# Patient Record
Sex: Male | Born: 1955 | Race: White | Hispanic: No | Marital: Married | Smoking: Former smoker
Health system: Southern US, Community
[De-identification: ages and names within clinical notes are randomized; demographics above are authoritative.]

## PROBLEM LIST (undated history)

## (undated) DIAGNOSIS — T4145XA Adverse effect of unspecified anesthetic, initial encounter: Secondary | ICD-10-CM

## (undated) DIAGNOSIS — C801 Malignant (primary) neoplasm, unspecified: Secondary | ICD-10-CM

## (undated) DIAGNOSIS — T8859XA Other complications of anesthesia, initial encounter: Secondary | ICD-10-CM

## (undated) DIAGNOSIS — M1990 Unspecified osteoarthritis, unspecified site: Secondary | ICD-10-CM

## (undated) DIAGNOSIS — I10 Essential (primary) hypertension: Secondary | ICD-10-CM

## (undated) DIAGNOSIS — E119 Type 2 diabetes mellitus without complications: Secondary | ICD-10-CM

## (undated) HISTORY — PX: TONSILLECTOMY: SUR1361

## (undated) HISTORY — DX: Type 2 diabetes mellitus without complications: E11.9

## (undated) HISTORY — PX: COLONOSCOPY: SHX174

## (undated) HISTORY — PX: SHOULDER ARTHROSCOPY WITH ROTATOR CUFF REPAIR: SHX5685

## (undated) HISTORY — PX: KNEE ARTHROSCOPY: SUR90

## (undated) HISTORY — PX: KNEE ARTHROSCOPY W/ DEBRIDEMENT: SHX1867

---

## 1998-08-22 ENCOUNTER — Emergency Department (HOSPITAL_COMMUNITY): Admission: EM | Admit: 1998-08-22 | Discharge: 1998-08-23 | Payer: Self-pay | Admitting: Emergency Medicine

## 1998-08-23 ENCOUNTER — Ambulatory Visit (HOSPITAL_COMMUNITY): Admission: RE | Admit: 1998-08-23 | Discharge: 1998-08-23 | Payer: Self-pay | Admitting: Internal Medicine

## 1998-08-23 ENCOUNTER — Encounter: Payer: Self-pay | Admitting: Internal Medicine

## 2004-01-30 ENCOUNTER — Emergency Department (HOSPITAL_COMMUNITY): Admission: EM | Admit: 2004-01-30 | Discharge: 2004-01-30 | Payer: Self-pay | Admitting: Emergency Medicine

## 2005-01-17 ENCOUNTER — Emergency Department (HOSPITAL_COMMUNITY): Admission: EM | Admit: 2005-01-17 | Discharge: 2005-01-18 | Payer: Self-pay | Admitting: Emergency Medicine

## 2005-07-17 ENCOUNTER — Emergency Department (HOSPITAL_COMMUNITY): Admission: EM | Admit: 2005-07-17 | Discharge: 2005-07-17 | Payer: Self-pay | Admitting: Emergency Medicine

## 2007-06-10 ENCOUNTER — Observation Stay (HOSPITAL_COMMUNITY): Admission: EM | Admit: 2007-06-10 | Discharge: 2007-06-10 | Payer: Self-pay | Admitting: Emergency Medicine

## 2008-03-10 ENCOUNTER — Encounter: Payer: Self-pay | Admitting: Gastroenterology

## 2008-03-22 ENCOUNTER — Encounter: Payer: Self-pay | Admitting: Gastroenterology

## 2008-04-21 DIAGNOSIS — E119 Type 2 diabetes mellitus without complications: Secondary | ICD-10-CM

## 2008-04-21 DIAGNOSIS — I10 Essential (primary) hypertension: Secondary | ICD-10-CM | POA: Insufficient documentation

## 2008-04-21 DIAGNOSIS — E785 Hyperlipidemia, unspecified: Secondary | ICD-10-CM | POA: Insufficient documentation

## 2008-04-22 ENCOUNTER — Ambulatory Visit: Payer: Self-pay | Admitting: Gastroenterology

## 2008-12-11 IMAGING — CT CT CHEST W/ CM
2 of 8 series · 12 of 46 positions shown, 19 images · IV contrast (APPLIED)
Comparison: None.

CLINICAL DATA: Fall.  Pain.  
CHEST CT WITH CONTRAST:
TECHNIQUE: Multidetector CT imaging of the chest was performed following the standard protocol during bolus administration of intravenous contrast.
Contrast:  100 cc Omnipaque 300
TECHNIQUE: Multidetector CT imaging of the abdomen was performed following the standard protocol during bolus administration of intravenous contrast.
TECHNIQUE: Multidetector CT imaging of the pelvis was performed following the standard protocol during bolus administration of intravenous contrast.

[Series 5: abd/pel 5.0 b31f · axial · 0.87mm/px · z∈[-701,-271]mm · 9 of 108 slices shown, 15 images]
[im 11/108  soft-tissue]
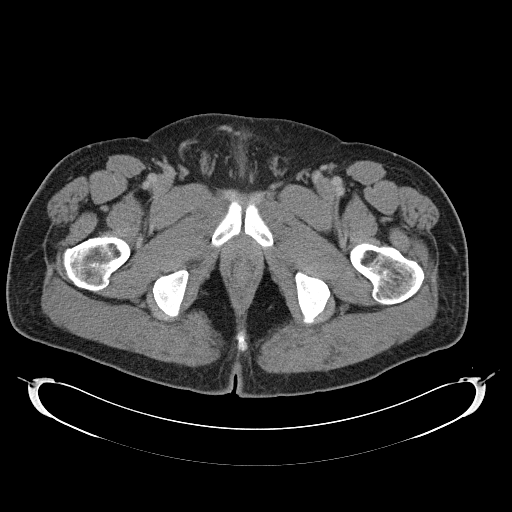
[im 11/108  bone]
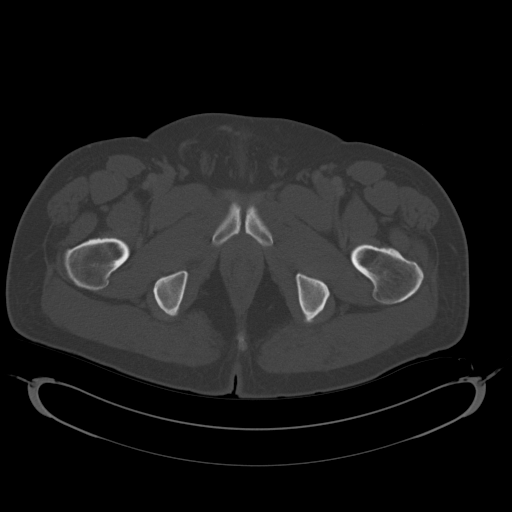
[im 22/108  soft-tissue]
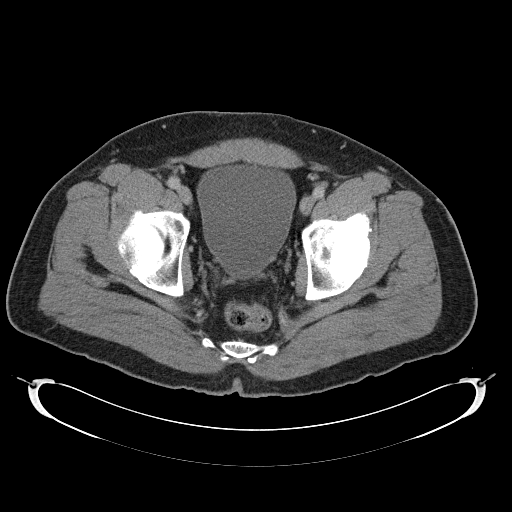
[im 33/108  soft-tissue]
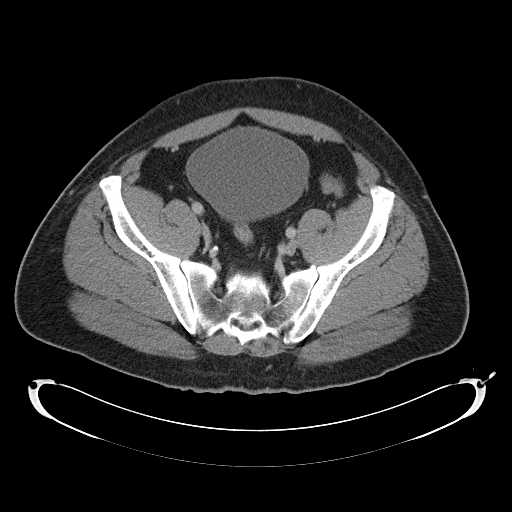
[im 43/108  soft-tissue]
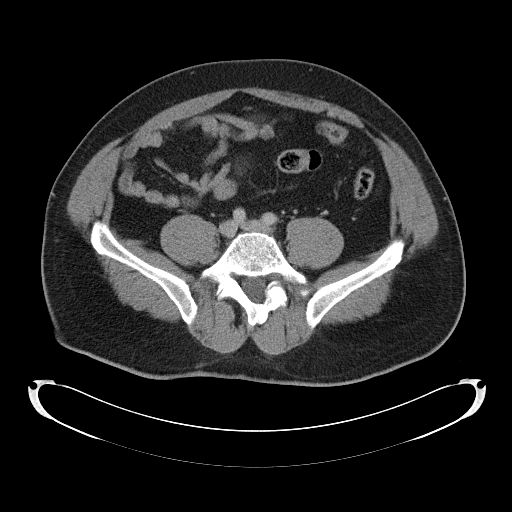
[im 54/108  soft-tissue]
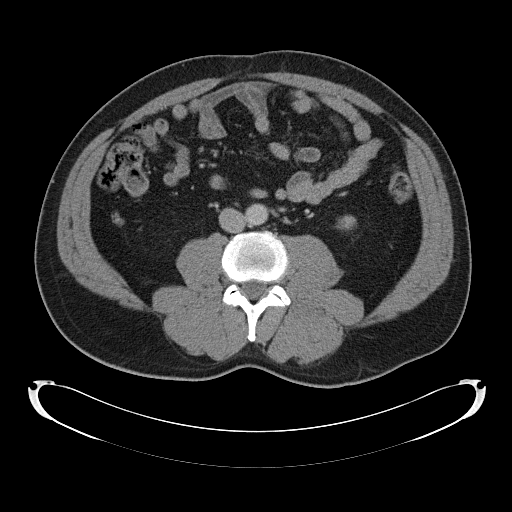
[im 65/108  soft-tissue]
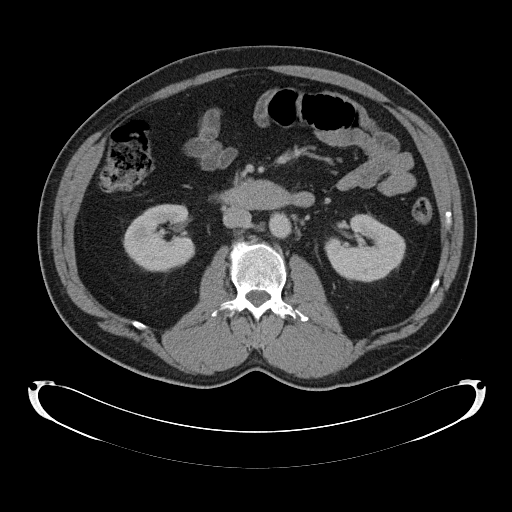
[im 65/108  lung]
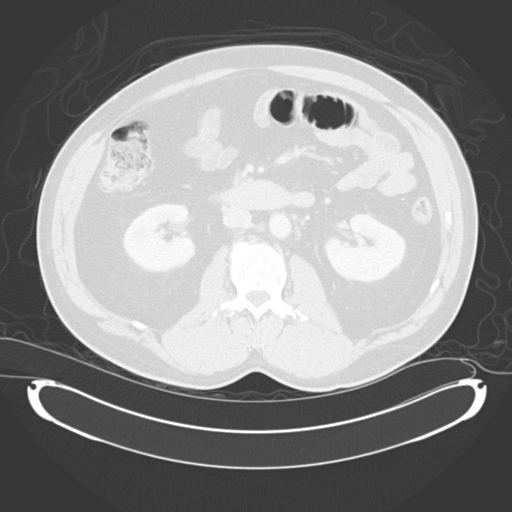
[im 75/108  soft-tissue]
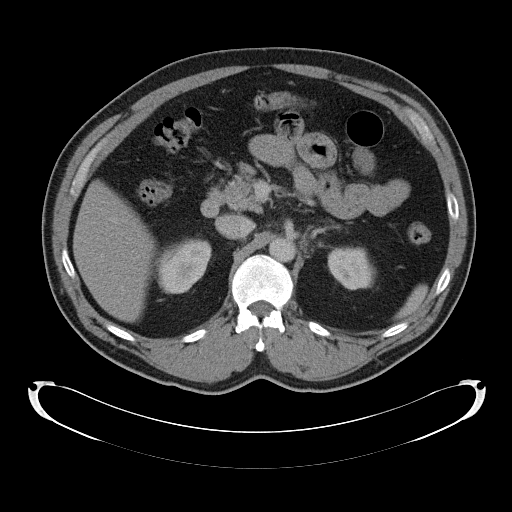
[im 75/108  lung]
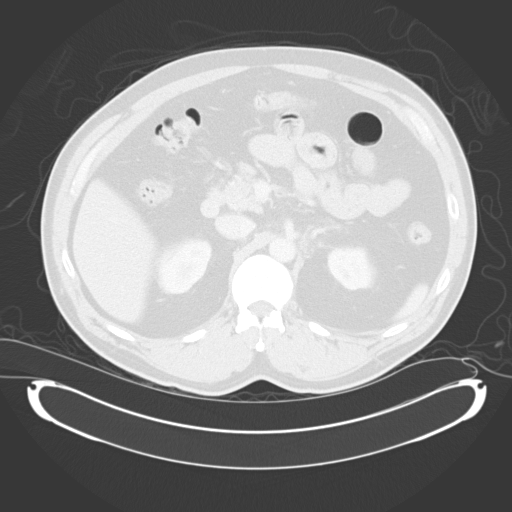
[im 86/108  soft-tissue]
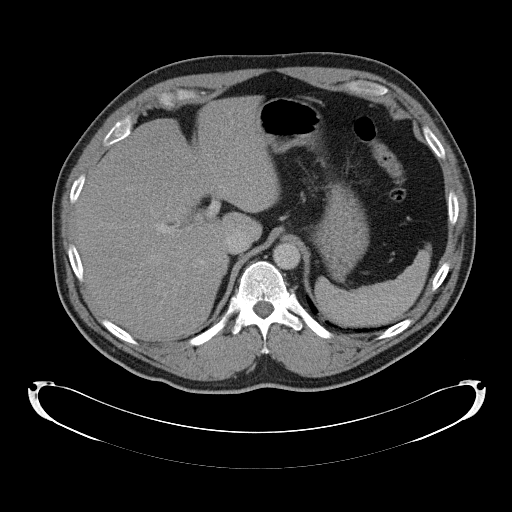
[im 86/108  lung]
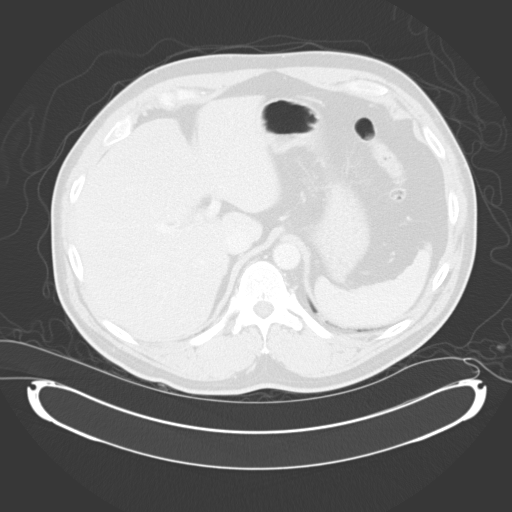
[im 97/108  soft-tissue]
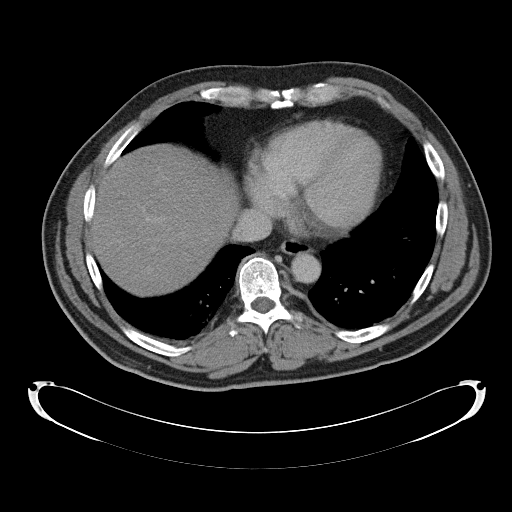
[im 97/108  lung]
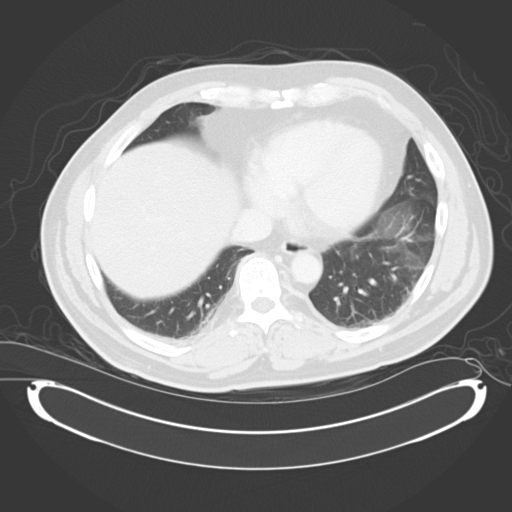
[im 97/108  bone]
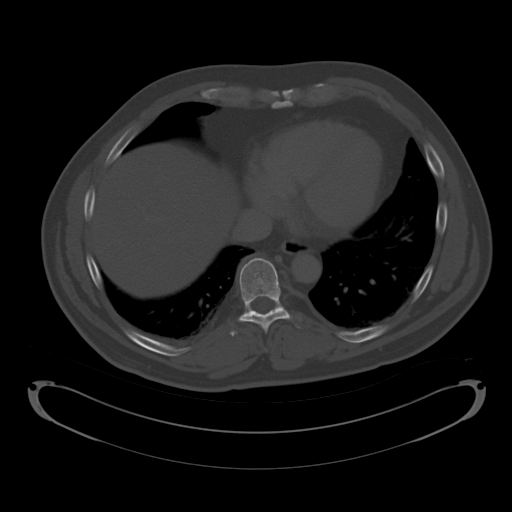

[Series 604: cor · coronal · 1.05mm/px · 3 of 149 slices shown, 4 images]
[im 30/149  soft-tissue]
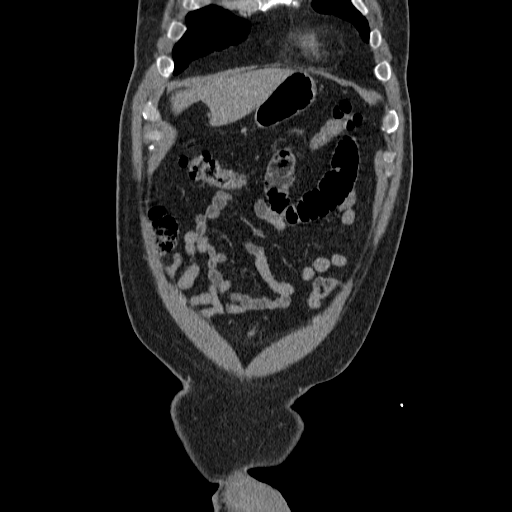
[im 60/149  soft-tissue]
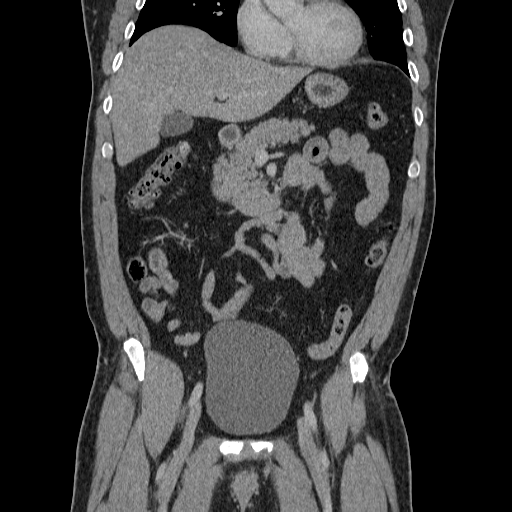
[im 60/149  bone]
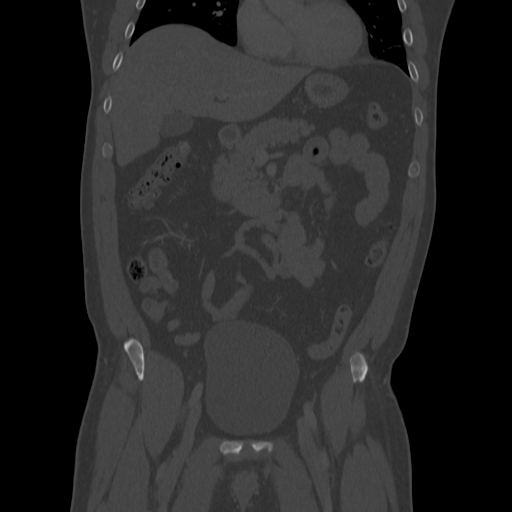
[im 89/149  soft-tissue]
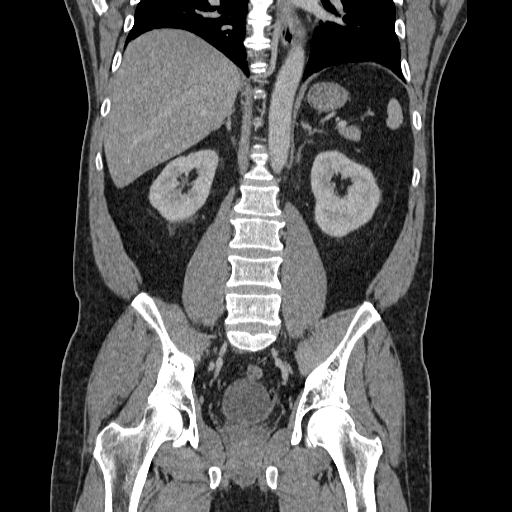

[12 of 46 positions shown; findings below may reference images not displayed]

FINDINGS: Heart and great vessels appear normal.  No pleural or pericardial effusion.  No axillary, hilar, mediastinal lymphadenopathy.  There is some scattered atelectasis in the lungs with a small focal opacity in the right lung apex which may represent a tiny pulmonary contusion.  No pneumothorax.  Dependent atelectatic change is noted.  Airway is unremarkable.
IMPRESSION: Possible small pulmonary contusion in the right lung apex.   
ABDOMEN CT WITH CONTRAST:
FINDINGS: The liver, gallbladder, adrenal glands, spleen, pancreas, and kidneys are unremarkable.  Stomach and small bowel have a normal CT appearance.  No free air or abnormal fluid collection.  Bones demonstrate a nondisplaced fracture of the anterolateral arch of the right 8th rib.  There is also an incomplete fracture of the lateral arch of the left 5th rib.  No other fracture.
IMPRESSION: Fractures of the right 8th and left 5th ribs nondisplaced.  No other acute abnormality in the abdomen.  
PELVIS CT WITH CONTRAST:
FINDINGS: No pelvic lymphadenopathy or fluid collection.  Urinary bladder appears normal.  Colon and appendix appear normal.  No focal bony abnormality.
IMPRESSION: No acute finding in the pelvis.

## 2009-09-14 ENCOUNTER — Ambulatory Visit: Payer: Self-pay | Admitting: Internal Medicine

## 2009-09-14 ENCOUNTER — Telehealth: Payer: Self-pay | Admitting: Internal Medicine

## 2009-09-14 DIAGNOSIS — M1990 Unspecified osteoarthritis, unspecified site: Secondary | ICD-10-CM | POA: Insufficient documentation

## 2009-09-19 ENCOUNTER — Ambulatory Visit: Payer: Self-pay | Admitting: Internal Medicine

## 2009-10-25 ENCOUNTER — Encounter (INDEPENDENT_AMBULATORY_CARE_PROVIDER_SITE_OTHER): Payer: Self-pay | Admitting: *Deleted

## 2009-10-26 ENCOUNTER — Ambulatory Visit: Payer: Self-pay | Admitting: Gastroenterology

## 2009-10-26 ENCOUNTER — Encounter (INDEPENDENT_AMBULATORY_CARE_PROVIDER_SITE_OTHER): Payer: Self-pay | Admitting: *Deleted

## 2009-11-09 ENCOUNTER — Ambulatory Visit: Payer: Self-pay | Admitting: Gastroenterology

## 2010-05-29 ENCOUNTER — Telehealth: Payer: Self-pay | Admitting: Internal Medicine

## 2010-07-16 LAB — CONVERTED CEMR LAB
AST: 33 units/L (ref 0–37)
Alkaline Phosphatase: 56 units/L (ref 39–117)
BUN: 15 mg/dL (ref 6–23)
Basophils Absolute: 0 10*3/uL (ref 0.0–0.1)
Bilirubin Urine: NEGATIVE
CO2: 29 meq/L (ref 19–32)
Calcium: 9.4 mg/dL (ref 8.4–10.5)
Chloride: 100 meq/L (ref 96–112)
Eosinophils Relative: 4 % (ref 0.0–5.0)
GFR calc non Af Amer: 93.79 mL/min (ref >60)
HDL: 44.9 mg/dL (ref >39.00)
Leukocytes, UA: NEGATIVE
Lymphs Abs: 1.3 10*3/uL (ref 0.7–4.0)
MCV: 93.5 fL (ref 78.0–100.0)
Neutro Abs: 2 10*3/uL (ref 1.4–7.7)
Neutrophils Relative %: 50.8 % (ref 43.0–77.0)
Nitrite: NEGATIVE
PSA: 0.55 ng/mL (ref 0.10–4.00)
Potassium: 4.2 meq/L (ref 3.5–5.1)
RBC: 4.83 M/uL (ref 4.22–5.81)
RDW: 11.5 % (ref 11.5–14.6)
Sodium: 137 meq/L (ref 135–145)
Specific Gravity, Urine: >=1.03 (ref 1.000–1.030)
TSH: 2.43 microintl units/mL (ref 0.35–5.50)
Total Bilirubin: 0.9 mg/dL (ref 0.3–1.2)
Total CHOL/HDL Ratio: 7
Total Protein: 7.2 g/dL (ref 6.0–8.3)
Triglycerides: 813 mg/dL — ABNORMAL HIGH (ref 0.0–149.0)
Urobilinogen, UA: 0.2 (ref 0.0–1.0)
VLDL: 162.6 mg/dL — ABNORMAL HIGH (ref 0.0–40.0)
pH: 5 (ref 5.0–8.0)

## 2010-07-18 NOTE — Procedures (Signed)
Summary: Colonoscopy  Patient: [name] [name] Note: All result statuses are Final unless otherwise noted.  Tests: (1) Colonoscopy (COL)   COL Colonoscopy           DONE     [facility]     [address]     [place], [place]  [address]           COLONOSCOPY PROCEDURE REPORT           PATIENT:  [name], [name]  MR#:  [id number]     BIRTHDATE:  1955-08-27, 54 yrs. old  GENDER:  male     ENDOSCOPIST:  Vania Rea. Jarold Motto, MD, [facility]     REF. BY:  Etta Grandchild, M.D.     PROCEDURE DATE:  11/09/2009     PROCEDURE:  Average-risk screening colonoscopy     G0121     ASA CLASS:  Class II     INDICATIONS:  Routine Risk Screening     MEDICATIONS:   Fentanyl 50 mcg IV, Versed 6 mg IV           DESCRIPTION OF PROCEDURE:   After the risks benefits and     alternatives of the procedure were thoroughly explained, informed     consent was obtained.  Digital rectal exam was performed and     revealed no abnormalities.   The LB [id number] endoscope     was introduced through the anus and advanced to the cecum, which     was identified by both the appendix and ileocecal valve, without     limitations.  The quality of the prep was excellent, using     MoviPrep.  The instrument was then slowly withdrawn as the colon     was fully examined.     <<PROCEDUREIMAGES>>           FINDINGS:  Scattered diverticula were found.  No polyps or cancers     were seen.  This was otherwise a normal examination of the colon.     Retroflexed views in the rectum revealed no abnormalities.    The     scope was then withdrawn from the patient and the procedure     completed.           COMPLICATIONS:  None     ENDOSCOPIC IMPRESSION:     1) Diverticula, scattered     2) No polyps or cancers     3) Otherwise normal examination     RECOMMENDATIONS:     1) Continue current colorectal screening recommendations for     "routine risk" patients with a repeat colonoscopy in 10 years.     2) high fiber  diet     REPEAT EXAM:  No           ______________________________     Vania Rea. Jarold Motto, MD, [facility]           CC:           n.     eSIGNED:   Vania Rea. Patricia Perales at 11/09/2009 09:40 AM           [place], Dennard Nip, [phone]  Note: An exclamation mark (!) indicates a result that was not dispersed into the flowsheet. Document Creation Date: 11/09/2009 9:41 AM _______________________________________________________________________  (1) Order result status: Final Collection or observation date-time: 11/09/2009 09:35 Requested date-time:  Receipt date-time:  Reported date-time:  Referring Physician:   Ordering Physician: Sheryn Bison [phone]) Specimen Source:  Source: Launa Grill  Order Number: [id number] Lab site:   Appended Document: Colonoscopy    Clinical Lists Changes  Observations: Added new observation of COLONNXTDUE: 10/2019 (11/09/2009 14:42)

## 2010-07-18 NOTE — Assessment & Plan Note (Signed)
Summary: discuss lab results/la   Vital Signs:  Patient profile:   55 year old male Height:      75 inches Weight:      250 pounds BMI:     31.36 O2 Sat:      96 % on Room air Temp:     97.3 degrees F oral Pulse rate:   73 / minute Pulse rhythm:   regular Resp:     16 per minute BP sitting:   140 / 88  (left arm) Cuff size:   large  Vitals Entered By: Rock Nephew CMA (September 19, 2009 8:08 AM)  Nutrition Counseling: Patient's BMI is greater than 25 and therefore counseled on weight management options.  O2 Flow:  Room air CC: follow-up visit//discuss lab results, Hypertension Management Is Patient Diabetic? Yes Did you bring your meter with you today? No Pain Assessment Patient in pain? no        Primary Care Provider:  Etta Grandchild MD  CC:  follow-up visit//discuss lab results and Hypertension Management.  History of Present Illness: He returns for a f/up on his labs with no complaints.  Hypertension History:      He denies headache, chest pain, palpitations, dyspnea with exertion, orthopnea, PND, peripheral edema, visual symptoms, neurologic problems, and syncope.        Positive major cardiovascular risk factors include male age 20 years old or older, diabetes, hyperlipidemia, and hypertension.  Negative major cardiovascular risk factors include negative family history for ischemic heart disease and non-tobacco-user status.        Further assessment for target organ damage reveals no history of ASHD, cardiac end-organ damage (CHF/LVH), stroke/TIA, peripheral vascular disease, renal insufficiency, or hypertensive retinopathy.      Problems Prior to Update: 1)  Routine General Medical Exam@health  Care Facl  (ICD-V70.0) 2)  Family History Diabetes 1st Degree Relative  (ICD-V18.0) 3)  Family History Breast Cancer 1st Degree Relative <50  (ICD-V16.3) 4)  Osteoarthritis  (ICD-715.90) 5)  Hyperlipidemia  (ICD-272.4) 6)  Diabetes Mellitus, Type II  (ICD-250.00) 7)   Hypertension  (ICD-401.9)  Medications Prior to Update: 1)  None  Current Medications (verified): 1)  Simcor 500-40 Mg Xr24h-Tab (Niacin-Simvastatin) .... One By Mouth Once Daily For Cholesterol 2)  Janumet 50-500 Mg Tabs (Sitagliptin-Metformin Hcl) .... One By Mouth Two Times A Day For Diabetes 3)  Ramipril 2.5 Mg Caps (Ramipril) .... One By Mouth Once Daily For Blood Pressure 4)  Aspir-Low 81 Mg Tbec (Aspirin) .... One By Mouth Once Daily  Allergies (verified): 1)  ! * Bee Stings  Past History:  Past Medical History: Reviewed history from 09/14/2009 and no changes required. Current Problems:  HYPERLIPIDEMIA (ICD-272.4) DIABETES MELLITUS, TYPE II (ICD-250.00) HYPERTENSION (ICD-401.9) Osteoarthritis  Past Surgical History: Reviewed history from 09/14/2009 and no changes required. Tonsillectomy  Family History: Reviewed history from 09/14/2009 and no changes required. Family History Breast cancer 1st degree relative <50 Family History Diabetes 1st degree relative Family History Hypertension  Social History: Reviewed history from 09/14/2009 and no changes required. Occupation:IT Married Never Smoked Alcohol use-yes Drug use-no Regular exercise-yes  Review of Systems       The patient complains of weight gain.  The patient denies anorexia, fever, syncope, peripheral edema, abdominal pain, difficulty walking, enlarged lymph nodes, and angioedema.    Physical Exam  General:  alert, well-developed, well-nourished, well-hydrated, appropriate dress, normal appearance, healthy-appearing, cooperative to examination, good hygiene, and overweight-appearing.   Head:  normocephalic, atraumatic, no abnormalities  observed, and no abnormalities palpated.   Mouth:  Oral mucosa and oropharynx without lesions or exudates.  Teeth in good repair. Neck:  supple, full ROM, no masses, no thyromegaly, no thyroid nodules or tenderness, no JVD, normal carotid upstroke, no carotid bruits, no  cervical lymphadenopathy, and no neck tenderness.   Lungs:  Normal respiratory effort, chest expands symmetrically. Lungs are clear to auscultation, no crackles or wheezes. Heart:  Normal rate and regular rhythm. S1 and S2 normal without gallop, murmur, click, rub or other extra sounds. Abdomen:  Bowel sounds positive,abdomen soft and non-tender without masses, organomegaly or hernias noted. Msk:  No deformity or scoliosis noted of thoracic or lumbar spine.   Pulses:  R and L carotid,radial,femoral,dorsalis pedis and posterior tibial pulses are full and equal bilaterally Extremities:  No clubbing, cyanosis, edema, or deformity noted with normal full range of motion of all joints.   Neurologic:  No cranial nerve deficits noted. Station and gait are normal. Plantar reflexes are down-going bilaterally. DTRs are symmetrical throughout. Sensory, motor and coordinative functions appear intact. Skin:  Intact without suspicious lesions or rashes Cervical Nodes:  no anterior cervical adenopathy and no posterior cervical adenopathy.   Psych:  Cognition and judgment appear intact. Alert and cooperative with normal attention span and concentration. No apparent delusions, illusions, hallucinations  Diabetes Management Exam:    Foot Exam (with socks and/or shoes not present):       Sensory-Pinprick/Light touch:          Left medial foot (L-4): normal          Left dorsal foot (L-5): normal          Left lateral foot (S-1): normal          Right medial foot (L-4): normal          Right dorsal foot (L-5): normal          Right lateral foot (S-1): normal       Sensory-Monofilament:          Left foot: normal          Right foot: normal       Inspection:          Left foot: normal          Right foot: normal       Nails:          Left foot: normal          Right foot: normal   Impression & Recommendations:  Problem # 1:  HYPERLIPIDEMIA (ICD-272.4) Assessment Deteriorated  His updated medication  list for this problem includes:    Simcor 500-40 Mg Xr24h-tab (Niacin-simvastatin) ..... One by mouth once daily for cholesterol  Labs Reviewed: SGOT: 33 (09/14/2009)   SGPT: 76 (09/14/2009)  10 Yr Risk Heart Disease: Not enough information   HDL:44.90 (09/14/2009)  Chol:318 (09/14/2009)  Trig:813.0 (09/14/2009)  Problem # 2:  DIABETES MELLITUS, TYPE II (ICD-250.00) Assessment: Deteriorated  His updated medication list for this problem includes:    Janumet 50-500 Mg Tabs (Sitagliptin-metformin hcl) ..... One by mouth two times a day for diabetes    Ramipril 2.5 Mg Caps (Ramipril) ..... One by mouth once daily for blood pressure    Aspir-low 81 Mg Tbec (Aspirin) ..... One by mouth once daily  Orders: Ophthalmology Referral (Ophthalmology)  Problem # 3:  HYPERTENSION (ICD-401.9) Assessment: Unchanged  His updated medication list for this problem includes:    Ramipril 2.5 Mg Caps (Ramipril) ..... One  by mouth once daily for blood pressure  BP today: 140/88 Prior BP: 124/82 (09/14/2009)  10 Yr Risk Heart Disease: Not enough information  Labs Reviewed: K+: 4.2 (09/14/2009) Creat: : 0.9 (09/14/2009)   Chol: 318 (09/14/2009)   HDL: 44.90 (09/14/2009)   TG: 813.0 (09/14/2009)  Complete Medication List: 1)  Simcor 500-40 Mg Xr24h-tab (Niacin-simvastatin) .... One by mouth once daily for cholesterol 2)  Janumet 50-500 Mg Tabs (Sitagliptin-metformin hcl) .... One by mouth two times a day for diabetes 3)  Ramipril 2.5 Mg Caps (Ramipril) .... One by mouth once daily for blood pressure 4)  Aspir-low 81 Mg Tbec (Aspirin) .... One by mouth once daily  Hypertension Assessment/Plan:      The patient's hypertensive risk group is category C: Target organ damage and/or diabetes.  Today's blood pressure is 140/88.  His blood pressure goal is < 130/80.  Patient Instructions: 1)  Please schedule a follow-up appointment in 3 months. 2)  It is important that you exercise regularly at least 20  minutes 5 times a week. If you develop chest pain, have severe difficulty breathing, or feel very tired , stop exercising immediately and seek medical attention. 3)  You need to lose weight. Consider a lower calorie diet and regular exercise.  4)  Schedule a colonoscopy/sigmoidoscopy to help detect colon cancer. 5)  Check your blood sugars regularly. If your readings are usually above 200  or below 70 you should contact our office. 6)  It is important that your Diabetic A1c level is checked every 3 months. 7)  See your eye doctor yearly to check for diabetic eye damage. 8)  Check your feet each night for sore areas, calluses or signs of infection. 9)  Check your Blood Pressure regularly. If it is above 130/80: you should make an appointment. Prescriptions: RAMIPRIL 2.5 MG CAPS (RAMIPRIL) One by mouth once daily for blood pressure  #30 x 11   Entered and Authorized by:   Etta Grandchild MD   Signed by:   Etta Grandchild MD on 09/19/2009   Method used:   Electronically to        [facility]  [address] [facility] [phone]* (retail)       [address]       [place], [place]  [address]       Ph: [phone] or [phone]       Fax: [phone]   RxID:   [id number] SIMCOR 500-40 MG XR24H-TAB (NIACIN-SIMVASTATIN) One by mouth once daily for cholesterol  #30 x 11   Entered and Authorized by:   Etta Grandchild MD   Signed by:   Etta Grandchild MD on 09/19/2009   Method used:   [facility] to        [facility]  [address] [phone][facility] (retail)       [address] [place], [place]  [address]       Ph: [phone] or [phone]       Fax: [phone]   RxID:   [id number] JANUMET 50-500 MG TABS (SITAGLIPTIN-METFORMIN HCL) One by mouth two times a day for diabetes  #224 x 0   Entered and Authorized by:   Etta Grandchild MD   Signed by:   Etta Grandchild MD on 09/19/2009   Method used:   Samples Given   RxID:   [id number] SIMCOR 500-40 MG XR24H-TAB (  NIACIN-SIMVASTATIN) One by mouth  once daily for cholesterol  #14 x 0   Entered and Authorized by:   Etta Grandchild MD   Signed by:   Etta Grandchild MD on 09/19/2009   Method used:   Samples Given   RxID:   [id number]    Not Administered:    Influenza Vaccine not given due to: declined

## 2010-07-18 NOTE — Letter (Signed)
Summary: [facility] Instructions  [facility]  [address] [place], [place] [address]   Phone: [phone]  Fax: [phone]       DAVINDER HAFF    [birth year]    MRN: [id number]        Procedure Day Dorna Bloom:  [facility]  11/09/09     Arrival Time:  8:00AM     Procedure Time:  9:00AM     Location of Procedure:                    _ X_  [facility] (4th Floor)                       PREPARATION FOR COLONOSCOPY WITH MOVIPREP   Starting 5 days prior to your procedure 11/04/09 do not eat nuts, seeds, popcorn, corn, beans, peas,  salads, or any raw vegetables.  Do not take any fiber supplements (e.g. Metamucil, Citrucel, and Benefiber).  THE DAY BEFORE YOUR PROCEDURE         DATE: 11/08/09  DAY: TUESDAY  1.  Drink clear liquids the entire day-NO SOLID FOOD  2.  Do not drink anything colored red or purple.  Avoid juices with pulp.  No orange juice.  3.  Drink at least 64 oz. (8 glasses) of fluid/clear liquids during the day to prevent dehydration and help the prep work efficiently.  CLEAR LIQUIDS INCLUDE: Water Jello Ice Popsicles Tea (sugar ok, no milk/cream) Powdered fruit flavored drinks Coffee (sugar ok, no milk/cream) Gatorade Juice: apple, white grape, white cranberry  Lemonade Clear bullion, consomm, broth Carbonated beverages (any kind) Strained chicken noodle soup Hard Candy                             4.  In the morning, mix first dose of MoviPrep solution:    Empty 1 Pouch A and 1 Pouch B into the disposable container    Add lukewarm drinking water to the top line of the container. Mix to dissolve    Refrigerate (mixed solution should be used within 24 hrs)  5.  Begin drinking the prep at 5:00 p.m. The MoviPrep container is divided by 4 marks.   Every 15 minutes drink the solution down to the next mark (approximately 8 oz) until the full liter is complete.   6.  Follow completed prep with 16 oz of clear liquid of your choice  (Nothing red or purple).  Continue to drink clear liquids until bedtime.  7.  Before going to bed, mix second dose of MoviPrep solution:    Empty 1 Pouch A and 1 Pouch B into the disposable container    Add lukewarm drinking water to the top line of the container. Mix to dissolve    Refrigerate  THE DAY OF YOUR PROCEDURE      DATE: 11/09/09  DAY: WEDNESDAY  Beginning at 4:00AM (5 hours before procedure):         1. Every 15 minutes, drink the solution down to the next mark (approx 8 oz) until the full liter is complete.  2. Follow completed prep with 16 oz. of clear liquid of your choice.    3. You may drink clear liquids until 7:00AM (2 HOURS BEFORE PROCEDURE).   MEDICATION INSTRUCTIONS  Unless otherwise instructed, you should take regular prescription medications with a small sip of water   as early as possible the morning of  your procedure.  Diabetic patients - see separate instructions.        OTHER INSTRUCTIONS  You will need a responsible adult at least 54 years of age to accompany you and drive you home.   This person must remain in the waiting room during your procedure.  Wear loose fitting clothing that is easily removed.  Leave jewelry and other valuables at home.  However, you may wish to bring a book to read or  an iPod/MP3 player to listen to music as you wait for your procedure to start.  Remove all body piercing jewelry and leave at home.  Total time from sign-in until discharge is approximately 2-3 hours.  You should go home directly after your procedure and rest.  You can resume normal activities the  day after your procedure.  The day of your procedure you should not:   Drive   Make legal decisions   Operate machinery   Drink alcohol   Return to work  You will receive specific instructions about eating, activities and medications before you leave.    The above instructions have been reviewed and explained to me by   Harlow Mares CMA  Duncan Dull)  Oct 26, 2009 8:35 AM      I fully understand and can verbalize these instructions _____________________________ Date _________

## 2010-07-18 NOTE — Miscellaneous (Signed)
Summary: DIR COL SCR-AGE....EM  Clinical Lists Changes  Medications: Added new medication of MOVIPREP 100 GM  SOLR (PEG-KCL-NACL-NASULF-NA ASC-C) As per prep instructions. - Signed Rx of MOVIPREP 100 GM  SOLR (PEG-KCL-NACL-NASULF-NA ASC-C) As per prep instructions.;  #1 x 0;  Signed;  Entered by: Harlow Mares CMA (AAMA);  Authorized by: Mardella Layman MD [facility];  Method used: [facility]  [phone] [address], [place], [place], [place]  [address], Ph: [phone] or [phone], Fax: [phone]    Prescriptions: MOVIPREP 100 GM  SOLR (PEG-KCL-NACL-NASULF-NA ASC-C) As per prep instructions.  #1 x 0   Entered by:   Harlow Mares CMA (AAMA)   Authorized by:   Mardella Layman MD [facility]   Signed by:   Harlow Mares CMA (AAMA) on 10/26/2009   Method used:   [facility] to        [facility]  [address] [phone]* (retail)       [address]       [place], [place]  [address]       Ph: [phone] or [phone]       Fax: [phone]   RxID:   [id number]

## 2010-07-18 NOTE — Assessment & Plan Note (Signed)
Summary: NEW [employer] PT PHY--PKG/OFF--STC   Vital Signs:  Patient profile:   55 year old male Height:      75 inches Weight:      254 pounds BMI:     31.86 O2 Sat:      96 % on Room air Temp:     97.4 degrees F oral Pulse rate:   79 / minute Pulse rhythm:   regular Resp:     16 per minute BP sitting:   124 / 82  (left arm) Cuff size:   large  Vitals Entered By: Rock Nephew CMA (September 14, 2009 9:18 AM)  Nutrition Counseling: Patient's BMI is greater than 25 and therefore counseled on weight management options.  O2 Flow:  Room air  Primary Care Provider:  Etta Grandchild MD   History of Present Illness: New to me for a complete physical.  Preventive Screening-Counseling & Management  Alcohol-Tobacco     Alcohol drinks/day: <1     Alcohol type: beer     >5/day in last 3 mos: no     Alcohol Counseling: not indicated; use of alcohol is not excessive or problematic     Feels need to cut down: no     Feels annoyed by complaints: no     Feels guilty re: drinking: no     Needs 'eye opener' in am: no     Smoking Status: never  Caffeine-Diet-Exercise     Does Patient Exercise: yes  Hep-HIV-STD-Contraception     Hepatitis Risk: no risk noted     HIV Risk: no risk noted     STD Risk: no risk noted     TSE monthly: yes     Testicular SE Education/Counseling to perform regular STE  Safety-Violence-Falls     Seat Belt Use: yes     Helmet Use: yes     Firearms in the Home: no firearms in the home     Smoke Detectors: yes     Violence in the Home: no risk noted     Sexual Abuse: no      Sexual History:  currently monogamous.        Drug Use:  never and no.        Blood Transfusions:  no.    Medications Prior to Update: 1)  Adult Aspirin Ec Low Strength 81 Mg Tbec (Aspirin) .... Take One By Mouth Once Daily 2)  Benazepril Hcl 10 Mg Tabs (Benazepril Hcl) .... Take Two By Mouth Once Daily  Current Medications (verified): 1)  None  Allergies (verified): 1)  ! * Bee  Stings  Past History:  Past Medical History: Current Problems:  HYPERLIPIDEMIA (ICD-272.4) DIABETES MELLITUS, TYPE II (ICD-250.00) HYPERTENSION (ICD-401.9) Osteoarthritis  Past Surgical History: Tonsillectomy  Family History: Family History Breast cancer 1st degree relative <50 Family History Diabetes 1st degree relative Family History Hypertension  Social History: Occupation: Married Never Smoked Alcohol use-yes Drug use-no Regular exercise-yes Smoking Status:  never Drug Use:  never, no Does Patient Exercise:  yes Hepatitis Risk:  no risk noted HIV Risk:  no risk noted STD Risk:  no risk noted Seat Belt Use:  yes Sexual History:  currently monogamous Blood Transfusions:  no  Review of Systems       The patient complains of weight gain.  The patient denies anorexia, fever, weight loss, hoarseness, chest pain, syncope, dyspnea on exertion, peripheral edema, prolonged cough, headaches, hemoptysis, abdominal pain, melena, hematochezia, severe indigestion/heartburn, hematuria, suspicious skin lesions, difficulty  walking, depression, abnormal bleeding, enlarged lymph nodes, angioedema, and testicular masses.   GU:  Denies decreased libido, discharge, dysuria, erectile dysfunction, genital sores, hematuria, incontinence, nocturia, urinary frequency, and urinary hesitancy.  Physical Exam  General:  alert, well-developed, well-nourished, well-hydrated, appropriate dress, normal appearance, healthy-appearing, cooperative to examination, good hygiene, and overweight-appearing.   Head:  normocephalic, atraumatic, no abnormalities observed, and no abnormalities palpated.   Eyes:  vision grossly intact, pupils equal, pupils round, and pupils reactive to light.   Mouth:  Oral mucosa and oropharynx without lesions or exudates.  Teeth in good repair. Neck:  supple, full ROM, no masses, no thyromegaly, no thyroid nodules or tenderness, no JVD, normal carotid upstroke, no carotid  bruits, no cervical lymphadenopathy, and no neck tenderness.   Chest Wall:  no deformities, no tenderness, and no masses.   Breasts:  No masses or gynecomastia noted Lungs:  Normal respiratory effort, chest expands symmetrically. Lungs are clear to auscultation, no crackles or wheezes. Heart:  Normal rate and regular rhythm. S1 and S2 normal without gallop, murmur, click, rub or other extra sounds. Abdomen:  Bowel sounds positive,abdomen soft and non-tender without masses, organomegaly or hernias noted. Rectal:  No external abnormalities noted. Normal sphincter tone. No rectal masses or tenderness. heme neg. stool. Genitalia:  circumcised, no hydrocele, no varicocele, no scrotal masses, no testicular masses or atrophy, no cutaneous lesions, and no urethral discharge.   Prostate:  Prostate gland firm and smooth, no enlargement, nodularity, tenderness, mass, asymmetry or induration. Msk:  No deformity or scoliosis noted of thoracic or lumbar spine.   Pulses:  R and L carotid,radial,femoral,dorsalis pedis and posterior tibial pulses are full and equal bilaterally Extremities:  No clubbing, cyanosis, edema, or deformity noted with normal full range of motion of all joints.   Neurologic:  No cranial nerve deficits noted. Station and gait are normal. Plantar reflexes are down-going bilaterally. DTRs are symmetrical throughout. Sensory, motor and coordinative functions appear intact. Skin:  Intact without suspicious lesions or rashes Cervical Nodes:  no anterior cervical adenopathy and no posterior cervical adenopathy.   Axillary Nodes:  no R axillary adenopathy and no L axillary adenopathy.   Inguinal Nodes:  no R inguinal adenopathy and no L inguinal adenopathy.   Psych:  Cognition and judgment appear intact. Alert and cooperative with normal attention span and concentration. No apparent delusions, illusions, hallucinations Additional Exam:  EKG is normal.  Diabetes Management Exam:    Foot Exam  (with socks and/or shoes not present):       Sensory-Pinprick/Light touch:          Left medial foot (L-4): normal          Left dorsal foot (L-5): normal          Left lateral foot (S-1): normal          Right medial foot (L-4): normal          Right dorsal foot (L-5): normal          Right lateral foot (S-1): normal       Sensory-Monofilament:          Left foot: normal          Right foot: normal       Inspection:          Left foot: normal          Right foot: normal       Nails:          Left  foot: normal          Right foot: normal   Impression & Recommendations:  Problem # 1:  ROUTINE GENERAL MEDICAL EXAM@HEALTH  CARE FACL (ICD-V70.0) Assessment New I discussed with the patient the need and technique for monthly testicular self-exam and skin exams.  I reiterated the need for healthy,safe living with respect to monogamous and protected sex, limited use of alcohol, avoiding drug abuse, no risk taking, and safe driving/seat belt usage.  Orders: Venipuncture (16109) Gastroenterology Referral (GI) Hemoccult Guaiac-1 spec.(in office) (82270) EKG w/ Interpretation (93000) TLB-Lipid Panel (80061-LIPID) TLB-BMP (Basic Metabolic Panel-BMET) (80048-METABOL) TLB-CBC Platelet - w/Differential (85025-CBCD) TLB-Hepatic/Liver Function Pnl (80076-HEPATIC) TLB-TSH (Thyroid Stimulating Hormone) (84443-TSH) TLB-A1C / Hgb A1C (Glycohemoglobin) (83036-A1C) TLB-PSA (Prostate Specific Antigen) (84153-PSA) TLB-Udip w/ Micro (81001-URINE)  Problem # 2:  DIABETES MELLITUS, TYPE II (ICD-250.00) Assessment: Unchanged  The following medications were removed from the medication list:    Adult Aspirin Ec Low Strength 81 Mg Tbec (Aspirin) .Marland Kitchen... Take one by mouth once daily    Benazepril Hcl 10 Mg Tabs (Benazepril hcl) .Marland Kitchen... Take two by mouth once daily  Orders: Venipuncture (60454) TLB-Lipid Panel (80061-LIPID) TLB-BMP (Basic Metabolic Panel-BMET) (80048-METABOL) TLB-CBC Platelet -  w/Differential (85025-CBCD) TLB-Hepatic/Liver Function Pnl (80076-HEPATIC) TLB-TSH (Thyroid Stimulating Hormone) (84443-TSH) TLB-A1C / Hgb A1C (Glycohemoglobin) (83036-A1C) TLB-PSA (Prostate Specific Antigen) (84153-PSA) TLB-Udip w/ Micro (81001-URINE)  Problem # 3:  HYPERLIPIDEMIA (ICD-272.4) Assessment: Improved  Orders: Venipuncture (09811) TLB-Lipid Panel (80061-LIPID) TLB-BMP (Basic Metabolic Panel-BMET) (80048-METABOL) TLB-CBC Platelet - w/Differential (85025-CBCD) TLB-Hepatic/Liver Function Pnl (80076-HEPATIC) TLB-TSH (Thyroid Stimulating Hormone) (84443-TSH) TLB-A1C / Hgb A1C (Glycohemoglobin) (83036-A1C) TLB-PSA (Prostate Specific Antigen) (84153-PSA) TLB-Udip w/ Micro (81001-URINE)  Problem # 4:  HYPERTENSION (ICD-401.9) Assessment: Improved  The following medications were removed from the medication list:    Benazepril Hcl 10 Mg Tabs (Benazepril hcl) .Marland Kitchen... Take two by mouth once daily  Orders: Venipuncture (91478) EKG w/ Interpretation (93000) TLB-Lipid Panel (80061-LIPID) TLB-BMP (Basic Metabolic Panel-BMET) (80048-METABOL) TLB-CBC Platelet - w/Differential (85025-CBCD) TLB-Hepatic/Liver Function Pnl (80076-HEPATIC) TLB-TSH (Thyroid Stimulating Hormone) (84443-TSH) TLB-A1C / Hgb A1C (Glycohemoglobin) (83036-A1C) TLB-PSA (Prostate Specific Antigen) (84153-PSA) TLB-Udip w/ Micro (81001-URINE)  BP today: 124/82  Patient Instructions: 1)  Please schedule a follow-up appointment in 2 weeks. 2)  It is important that you exercise regularly at least 20 minutes 5 times a week. If you develop chest pain, have severe difficulty breathing, or feel very tired , stop exercising immediately and seek medical attention. 3)  You need to lose weight. Consider a lower calorie diet and regular exercise.  4)  Schedule a colonoscopy/sigmoidoscopy to help detect colon cancer. 5)  Check your blood sugars regularly. If your readings are usually above 200  or below 70 you should  contact our office. 6)  It is important that your Diabetic A1c level is checked every 3 months. 7)  See your eye doctor yearly to check for diabetic eye damage. 8)  Check your feet each night for sore areas, calluses or signs of infection. 9)  Check your Blood Pressure regularly. If it is above 130/80: you should make an appointment.

## 2010-07-18 NOTE — Letter (Signed)
Summary: Diabetic Instructions  [facility]  [address] [place], [place] [address]   Phone: [phone]  Fax: [phone]    [name] [name] 24-Sep-1955 MRN: [id number]   X   ORAL DIABETIC MEDICATION INSTRUCTIONS               (Janumet) The day before your procedure:   Take your diabetic pill as you do normally  The day of your procedure:   Do not take your diabetic pill    We will check your blood sugar levels during the admission process and again in Recovery before discharging you home

## 2010-07-18 NOTE — Letter (Signed)
Summary: Lipid Letter  [facility]  [address] [place], [place] [address]   Phone: [phone]  Fax: [phone]    09/14/2009  [name] [name] [address] [place], [place]  [address]  Dear [name] [name]:  We have carefully reviewed your last lipid profile from  and the results are noted below with a summary of recommendations for lipid management.    Cholesterol:       318     Goal: <200   HDL "good" Cholesterol:   62.95     Goal: >40   LDL "bad" Cholesterol:   137     Goal: <100   Triglycerides:       813.0     Goal: <150    WOW    TLC Diet (Therapeutic Lifestyle Change): Saturated Fats & Transfatty acids should be kept < 7% of total calories ***Reduce Saturated Fats Polyunstaurated Fat can be up to 10% of total calories Monounsaturated Fat Fat can be up to 20% of total calories Total Fat should be no greater than 25-35% of total calories Carbohydrates should be 50-60% of total calories Protein should be approximately 15% of total calories Fiber should be at least 20-30 grams a day ***Increased fiber may help lower LDL Total Cholesterol should be < 200mg /day Consider adding plant stanol/sterols to diet (example: Benacol spread) ***A higher intake of unsaturated fat may reduce Triglycerides and Increase HDL    Adjunctive Measures (may lower LIPIDS and reduce risk of Heart Attack) include: Aerobic Exercise (20-30 minutes 3-4 times a week) Limit Alcohol Consumption Weight Reduction Aspirin 75-81 mg a day by mouth (if not allergic or contraindicated) Dietary Fiber 20-30 grams a day by mouth     Current Medications:  None If you have any questions, please call. We appreciate being able to work with you.   Sincerely,    [facility] Etta Grandchild MD

## 2010-07-18 NOTE — Progress Notes (Signed)
  Phone Note Outgoing Call   Summary of Call: pls tell him that his blood sugar is 300 and he needs to come in asap to start meds for diabetes Initial call taken by: Etta Grandchild MD,  September 14, 2009 3:03 PM  Follow-up for Phone Call        Called patient/ no answer will try again later.Marland KitchenMarland KitchenAlvy Beal Archie CMA  September 14, 2009 4:09 PM   Additional Follow-up for Phone Call Additional follow up Details #1::        Spoke with patient and advised per MD. Patient not able to come in until 09/19/2009. Appt set for that day to follow up on labs. Additional Follow-up by: Rock Nephew CMA,  September 15, 2009 4:04 PM

## 2010-07-20 NOTE — Progress Notes (Signed)
Summary: RF   Phone Note Call from Patient Call back at Pt's# [phone]   Caller: Wife Summary of Call: Patient is requesting rx for janumet Initial call taken by: Lamar Sprinkles, CMA,  May 29, 2010 4:44 PM  Follow-up for Phone Call        Pt informed  Follow-up by: Lamar Sprinkles, CMA,  May 29, 2010 5:09 PM    Prescriptions: JANUMET 50-500 MG TABS (SITAGLIPTIN-METFORMIN HCL) One by mouth two times a day for diabetes  #180 x 1   Entered by:   Lamar Sprinkles, CMA   Authorized by:   Etta Grandchild MD   Signed by:   Lamar Sprinkles, CMA on 05/29/2010   Method used:   Electronically to        [facility]  [address] [phone]* (retail)       [address] [place], [place]  [address]       Ph: [phone] or [phone]       Fax: [phone]   RxID:   [id number]

## 2010-10-31 NOTE — H&P (Signed)
NAME:  [name] [name], [name] [name]              ACCOUNT NO.:  [id number]   MEDICAL RECORD NO.:  [id number]          PATIENT TYPE:  INP   LOCATION:  5013                         FACILITY:  [facility]   PHYSICIAN:  Clovis Pu. Cornett, M.D.DATE OF BIRTH:  05-28-1956   DATE OF ADMISSION:  06/09/2007  DATE OF DISCHARGE:                              HISTORY & PHYSICAL   CHIEF COMPLAINT:  Fall.   HISTORY OF PRESENT ILLNESS:  The patient is 55 year old male who fell  off a deer stand earlier tonight.  He fell on his right side complaining  of right-sided chest pain and neck pain and low back pain.  He was  evaluated in the emergency room.  I was asked to see him at the request  of Dr. Radford Pax, in consultation for this.  Chief complaint is right-sided  chest pain with deep inspiration.  It is sharp in nature, made worse by  deep breathing.  He is having low back pain as well with movement.   PAST MEDICAL HISTORY:  History of cervical degenerative joint disease,  status post chiropractic manipulation for a bulging disk.   PAST SURGICAL HISTORY:  None.   PAST MEDICAL HISTORY:  1. Diabetes mellitus.  2. Hypertension.   SOCIAL HISTORY:  Denies tobacco or alcohol use currently, or drug use.   ALLERGIES:  NO KNOWN DRUG ALLERGIES.   MEDICATIONS:  Include metformin and aspirin.   REVIEW OF SYSTEMS:  A 15-point review of systems as stated above,  otherwise negative.   FAMILY HISTORY:  Noncontributory.   PHYSICAL EXAM:  VITAL SIGNS:  Temperature 99, pulse 92, blood pressure  104/64.  HEENT:  Extraocular movements are intact.  Oropharynx clear.  NECK:  Some mild tenderness over his lower cervical spine, but he has  full range of motion with minimal discomfort.  He is in a cervical  collar.  CHEST:  Right-sided rib fractures and tenderness noted.  Lung sounds  clear bilaterally.  Chest wall motion normal.  CARDIOVASCULAR:  Regular  rate and rhythm without rub, murmur, or gallop.  EXTREMITIES:  Warm,  well-perfused.  ABDOMEN:  Soft, nontender.  No rebound or guarding.  EXTREMITIES:  No evidence of trauma.  No clubbing, muscle tone normal,  range of motion normal.  NEURO:  Glasgow coma scale is 15.  Motor and sensory function are  grossly intact.  BACK:  He is tender over his lower lumbar, vertebral bodies minimally to  palpation.   DIAGNOSTIC STUDIES:  Head CT is normal.  On the cervical CT scan, he has  a perched C5-C6 facets.  These may be chronic.  Flexion/extension shows  this to be a stable pattern and is probably chronic reflecting his  degenerative joint disease.  Chest CT shows rib fracture on the right,  no hemopneumothorax, no significant pulmonary contusion, even though one  was written the a report.  I do not see one myself.  Abdomen normal.   IMPRESSION:  1. Fall with right-sided rib fractures.  2. Questionable old versus new C5-C6 perched facets.   PLAN:  Neurosurgery has seen his films.  He does not have  any  significant acute symptoms it looks like, but until I hear back from  them, keep  him in a cervical collar.  He will be admitted for IV fluids  and for pain management for his rib fractures.      Thomas A. Cornett, M.D.  Electronically Signed     TAC/MEDQ  D:  06/10/2007  T:  06/10/2007  Job:  [id number]

## 2010-10-31 NOTE — Discharge Summary (Signed)
NAME:  [name] [name], [name] [name] NO.:  [id number]   MEDICAL RECORD NO.:  [id number]          PATIENT TYPE:  INP   LOCATION:  5013                         FACILITY:  [facility]   PHYSICIAN:  Gabrielle Dare. Janee Morn, M.D.DATE OF BIRTH:  [birth year]   DATE OF ADMISSION:  06/09/2007  DATE OF DISCHARGE:  06/10/2007                               DISCHARGE SUMMARY   DISCHARGE DIAGNOSES:  1. Fall.  2. Single rib fracture.  3. Chronic neck pain with radiologic abnormality.  4. Diabetes.  5. Hypertension.   CONSULTANTS:  Clydene Fake, M.D., Neurosurgery.   PROCEDURE:  None.   HISTORY OF PRESENT ILLNESS:  This is a 55 year old male who fell  approximately 12 feet backwards out of a deer stand, landing on his  neck.  He came in as non-trauma code complaining of right rib and lower  back pain.Because of the abnormality and the radiographs of his neck, we  are asked to admit for pain control and observation as well as  neurosurgical consultation.   HOSPITAL COURSE:  The patient did well overnight in the hospital.  Neurosurgery team came and was of the opinion that his neck abnormality  was chronic and required no intervention.  He was able to tolerate p.o.  pain medicines and ambulate without difficulty and was able to be  discharged home in good condition in the care of his family.   DISCHARGE MEDICATIONS:  1. Percocet 5/325, take one to two p.o. q.4 h p.r.n. pain, #60 with no      refill.  2. In addition, he is to resume his home medications as directed.   FOLLOWUP:  The patient will call trauma service with any questions or  concerns; otherwise followup will be on an as-needed basis.      Earney Hamburg, P.A.      Gabrielle Dare Janee Morn, M.D.  Electronically Signed    MJ/MEDQ  D:  06/10/2007  T:  06/10/2007  Job:  [id number]   cc:   Clydene Fake, M.D.

## 2011-03-23 LAB — DIFFERENTIAL
Basophils Absolute: 0.1
Eosinophils Absolute: 0 — ABNORMAL LOW
Eosinophils Relative: 0
Monocytes Absolute: 0.7
Neutrophils Relative %: 89 — ABNORMAL HIGH

## 2011-03-23 LAB — CBC
HCT: 41.5
Hemoglobin: 14.3
MCHC: 34.3
MCV: 90.8
Platelets: 165
RBC: 4.57

## 2011-03-23 LAB — I-STAT 8, (EC8 V) (CONVERTED LAB)
Glucose, Bld: 205 — ABNORMAL HIGH
HCT: 45
Hemoglobin: 15.3
Operator id: 234501
Potassium: 4.2
TCO2: 24
pH, Ven: 7.364 — ABNORMAL HIGH

## 2013-07-08 ENCOUNTER — Ambulatory Visit (INDEPENDENT_AMBULATORY_CARE_PROVIDER_SITE_OTHER): Payer: Managed Care, Other (non HMO) | Admitting: Endocrinology

## 2013-07-08 VITALS — HR 81 | Ht 75.0 in | Wt 224.0 lb

## 2013-07-08 DIAGNOSIS — E119 Type 2 diabetes mellitus without complications: Secondary | ICD-10-CM

## 2013-07-08 MED ORDER — GLUCOSE BLOOD VI STRP: 1.0000 | ORAL_STRIP | Freq: Every day | Status: DC

## 2013-07-08 MED ORDER — SITAGLIPTIN PHOSPHATE 100 MG PO TABS: 100.0000 mg | ORAL_TABLET | Freq: Every day | ORAL | Status: DC

## 2013-07-08 MED ORDER — VARENICLINE TARTRATE 0.5 MG PO TABS: 0.5000 mg | ORAL_TABLET | Freq: Two times a day (BID) | ORAL | Status: DC

## 2013-07-08 NOTE — Patient Instructions (Addendum)
good diet and exercise habits significanly improve the control of your diabetes.  please let me know if you wish to be referred to a dietician.  high blood sugar is very risky to your health.  you should see an eye doctor every year.  You are at higher than average risk for pneumonia and hepatitis-B.  You should be vaccinated against both.   controlling your blood pressure and cholesterol drastically reduces the damage diabetes does to your body.  this also applies to quitting smoking.  please discuss these with your doctor.  check your blood sugar once a day.  vary the time of day when you check, between before the 3 meals, and at bedtime.  also check if you have symptoms of your blood sugar being too high or too low.  please keep a record of the readings and bring it to your next appointment here.  You can write it on any piece of paper.  please call us sooner if your blood sugar goes below 70, or if you have a lot of readings over 200.   Here is a meter.  i have sent a prescription to your pharmacy, for strips.   i have also sent prescriptions to your pharmacy, for an additional diabetes medication, and for chantix.   Please come back for a follow-up appointment in 1 month.

## 2013-07-08 NOTE — Progress Notes (Signed)
   Subjective:    Patient ID: [name] [name], male    DOB: 1956-02-11, 58 y.o.   MRN: [id number]  HPI pt states DM was dx'ed in 2005; he has mild neuropathy of the lower extremities; he is unaware of any associated chronic complications.  he has never been on insulin.  pt says his diet is much better recently, but exercise is limited by right knee pain.  He does not check cbg's.  No past medical history on file.  No past surgical history on file.  History   Social History  . Marital Status: Married    Spouse Name: N/A    Number of Children: N/A  . Years of Education: N/A   Occupational History  . Not on file.   Social History Main Topics  . Smoking status: Not on file  . Smokeless tobacco: Not on file  . Alcohol Use: Not on file  . Drug Use: Not on file  . Sexual Activity: Not on file   Other Topics Concern  . Not on file   Social History Narrative  . No narrative on file    No current outpatient prescriptions on file prior to visit.   No current facility-administered medications on file prior to visit.    Not on File  No family history on file.  Pulse 81  Ht 6\' 3"  (1.905 m)  Wt 224 lb (101.606 kg)  BMI 28.00 kg/m2  SpO2 98%     Review of Systems He has chronic foot pain.  denies fever, blurry vision, headache, chest pain, sob, n/v, urinary frequency, cramps, excessive diaphoresis, memory loss, depression, ED sxs, rhinorrhea, and easy bruising.  He has lost 25 lbs recently.      Objective:   Physical Exam VS: see vs page GEN: no distress HEAD: head: no deformity eyes: no periorbital swelling, no proptosis external nose and ears are normal mouth: no lesion seen NECK: supple, thyroid is not enlarged CHEST WALL: no deformity LUNGS: clear to auscultation BREASTS:  No gynecomastia CV: reg rate and rhythm, no murmur ABD: abdomen is soft, nontender.  no hepatosplenomegaly.  not distended.  no hernia MUSCULOSKELETAL: muscle bulk and strength are grossly  normal.  no obvious joint swelling.  gait is normal and steady PULSES:no carotid bruit NEURO:  cn 2-12 grossly intact.   readily moves all 4's.   SKIN:  Normal texture and temperature.  No rash or suspicious lesion is visible.   NODES:  None palpable at the neck PSYCH: alert, well-oriented.  Does not appear anxious nor depressed.   outside test results are reviewed: A1c=9.6    Assessment & Plan:  DM: poor control Smoking: this exacerbates the development of chronic DM complications. Knee pain: this limits exercise rx of DM. Weight loss: this helps the control of DM

## 2013-07-23 ENCOUNTER — Other Ambulatory Visit: Payer: Self-pay

## 2013-07-23 MED ORDER — GLUCOSE BLOOD VI STRP: 1.0000 | ORAL_STRIP | Freq: Every day | Status: AC

## 2013-08-26 ENCOUNTER — Ambulatory Visit (INDEPENDENT_AMBULATORY_CARE_PROVIDER_SITE_OTHER): Payer: Managed Care, Other (non HMO) | Admitting: Endocrinology

## 2013-08-26 ENCOUNTER — Encounter: Payer: Self-pay | Admitting: Endocrinology

## 2013-08-26 VITALS — BP 126/88 | HR 73 | Temp 98.2°F | Wt 229.0 lb

## 2013-08-26 DIAGNOSIS — E119 Type 2 diabetes mellitus without complications: Secondary | ICD-10-CM

## 2013-08-26 MED ORDER — CANAGLIFLOZIN 300 MG PO TABS: 1.0000 | ORAL_TABLET | Freq: Every day | ORAL | Status: DC

## 2013-08-26 NOTE — Patient Instructions (Signed)
check your blood sugar once a day.  vary the time of day when you check, between before the 3 meals, and at bedtime.  also check if you have symptoms of your blood sugar being too high or too low.  please keep a record of the readings and bring it to your next appointment here.  You can write it on any piece of paper.  please call us sooner if your blood sugar goes below 70, or if you have a lot of readings over 200.   Please come back for a follow-up appointment in 3 weeks.  i have sent a prescription to your pharmacy, to add "invokana."

## 2013-08-26 NOTE — Progress Notes (Signed)
   Subjective:    Patient ID: [name] [name], male    DOB: 1955/10/26, 58 y.o.   MRN: [id number]  HPI pt returns for f/u of type 2 DM (dx'ed 2005; he has mild neuropathy of the lower extremities; he is unaware of any associated chronic complications; he has never been on insulin).  no cbg record, but states cbg's are 150-200's.  There is no trend throughout the day. He will have right TKR next month.   No past medical history on file.  No past surgical history on file.  History   Social History  . Marital Status: Married    Spouse Name: N/A    Number of Children: N/A  . Years of Education: N/A   Occupational History  . Not on file.   Social History Main Topics  . Smoking status: Never Smoker   . Smokeless tobacco: Not on file  . Alcohol Use: Yes  . Drug Use: Not on file  . Sexual Activity: Not on file   Other Topics Concern  . Not on file   Social History Narrative  . No narrative on file    Current Outpatient Prescriptions on File Prior to Visit  Medication Sig Dispense Refill  . metFORMIN (GLUCOPHAGE) 500 MG tablet 500 mg. Take 2 tablets twice a day.      . sitaGLIPtin (JANUVIA) 100 MG tablet Take 1 tablet (100 mg total) by mouth daily.  30 tablet  11  . varenicline (CHANTIX) 0.5 MG tablet Take 1 tablet (0.5 mg total) by mouth 2 (two) times daily.  60 tablet  5  . glucose blood (ONETOUCH VERIO) test strip 1 each by Other route daily. And lancets 1/day 250.02  100 each  12   No current facility-administered medications on file prior to visit.   Not on File  No family history on file.  BP 126/88  Pulse 73  Temp(Src) 98.2 F (36.8 C) (Oral)  Wt 229 lb (103.874 kg)  SpO2 94%  Review of Systems denies hypoglycemia and weight change.      Objective:   Physical Exam VITAL SIGNS:  See vs page GENERAL: no distress PSYCH: Alert and well-oriented.  Does not appear anxious nor depressed.     Assessment & Plan:  Type 2 DM: he needs increased rx OA: in view of  his upcoming TKR, he needs prompt improvement in glycemic control.

## 2013-09-07 ENCOUNTER — Encounter (HOSPITAL_COMMUNITY): Payer: Self-pay | Admitting: Pharmacy Technician

## 2013-09-14 ENCOUNTER — Other Ambulatory Visit (HOSPITAL_COMMUNITY): Payer: Managed Care, Other (non HMO)

## 2013-09-14 ENCOUNTER — Other Ambulatory Visit: Payer: Self-pay | Admitting: Physician Assistant

## 2013-09-15 ENCOUNTER — Encounter: Payer: Self-pay | Admitting: Endocrinology

## 2013-09-15 ENCOUNTER — Ambulatory Visit (INDEPENDENT_AMBULATORY_CARE_PROVIDER_SITE_OTHER): Payer: Managed Care, Other (non HMO) | Admitting: Endocrinology

## 2013-09-15 VITALS — BP 126/82 | HR 94 | Temp 97.8°F | Ht 75.0 in | Wt 221.0 lb

## 2013-09-15 DIAGNOSIS — E119 Type 2 diabetes mellitus without complications: Secondary | ICD-10-CM

## 2013-09-15 MED ORDER — COLESEVELAM HCL 625 MG PO TABS: ORAL_TABLET | ORAL | Status: DC

## 2013-09-15 NOTE — Progress Notes (Signed)
   Subjective:    Patient ID: [name] [name], male    DOB: December 26, 1955, 58 y.o.   MRN: [id number]  HPI pt returns for f/u of type 2 DM (dx'ed 2005; he has mild neuropathy of the lower extremities; he is unaware of any associated chronic complications; he has never been on insulin).  no cbg record, but states cbg's are in the mid-100's.  There is no trend throughout the day. He will have right TKR soon.  No past medical history on file.  No past surgical history on file.  History   Social History  . Marital Status: Married    Spouse Name: N/A    Number of Children: N/A  . Years of Education: N/A   Occupational History  . Not on file.   Social History Main Topics  . Smoking status: Never Smoker   . Smokeless tobacco: Not on file  . Alcohol Use: Yes  . Drug Use: Not on file  . Sexual Activity: Not on file   Other Topics Concern  . Not on file   Social History Narrative  . No narrative on file    Current Outpatient Prescriptions on File Prior to Visit  Medication Sig Dispense Refill  . Canagliflozin 300 MG TABS Take 1 tablet by mouth daily.      Marland Kitchen glucose blood (ONETOUCH VERIO) test strip 1 each by Other route daily. And lancets 1/day 250.02  100 each  12  . HYDROcodone-acetaminophen (NORCO/VICODIN) 5-325 MG per tablet Take 1 tablet by mouth every 6 (six) hours as needed for moderate pain.      . metFORMIN (GLUCOPHAGE) 500 MG tablet Take 1,000 mg by mouth 2 (two) times daily. Take 2 tablets twice a day.      . sitaGLIPtin (JANUVIA) 100 MG tablet Take 100 mg by mouth daily.      . varenicline (CHANTIX) 0.5 MG tablet Take 0.5 mg by mouth 2 (two) times daily.       No current facility-administered medications on file prior to visit.    Not on File  No family history on file.  BP 126/82  Pulse 94  Temp(Src) 97.8 F (36.6 C) (Oral)  Ht 6\' 3"  (1.905 m)  Wt 221 lb (100.245 kg)  BMI 27.62 kg/m2  SpO2 95%  Review of Systems Denies weight change and polyuria.        Objective:   Physical Exam VITAL SIGNS:  See vs page GENERAL: no distress Ext: 1+ bilat leg edema     Assessment & Plan:  Type 2 DM: he needs increased rx OA: glucemic control is good enough to clear for surgery, so the form is done.

## 2013-09-15 NOTE — Patient Instructions (Addendum)
check your blood sugar once a day.  vary the time of day when you check, between before the 3 meals, and at bedtime.  also check if you have symptoms of your blood sugar being too high or too low.  please keep a record of the readings and bring it to your next appointment here.  You can write it on any piece of paper.  please call us sooner if your blood sugar goes below 70, or if you have a lot of readings over 200.   Please come back for a follow-up appointment in 3 months.  i have sent a prescription to your pharmacy, to add "welchol."

## 2013-09-16 ENCOUNTER — Encounter: Payer: Self-pay | Admitting: Physician Assistant

## 2013-09-16 ENCOUNTER — Other Ambulatory Visit: Payer: Self-pay | Admitting: Physician Assistant

## 2013-09-16 NOTE — H&P (Signed)
TOTAL KNEE ADMISSION H&P  Patient is being admitted for right total knee arthroplasty.  Subjective:  Chief Complaint:right knee pain.  HPI: OTHAL [name] [name] [name], 58 y.o. male, has a history of pain [name] functional disability in the right knee due to arthritis [name] has failed non-surgical conservative treatments for greater than 12 weeks to includeNSAID's [name]/or analgesics, corticosteriod injections, viscosupplementation injections, flexibility [name] strengthening excercises, supervised PT with diminished ADL's post treatment, use of assistive devices [name] activity modification.  Onset of symptoms was gradual, starting 10 years ago with gradually worsening course since that time. The patient noted prior procedures on the knee to include  [name] [name], [name] [name] ACL reconstruction on the right knee(s).  Patient currently rates pain in the right knee(s) at 10 out of 10 with activity. Patient has night pain, worsening of pain with activity [name] weight bearing, pain that interferes with activities of daily living, pain with passive range of motion, crepitus [name] joint swelling.  Patient has evidence of subchondral sclerosis, periarticular osteophytes [name] joint space narrowing by imaging studies.  There is no active infection.  Patient Active Problem List   Diagnosis Date Noted  . OSTEOARTHRITIS 09/14/2009  . DIABETES MELLITUS, TYPE II 04/21/2008  . HYPERLIPIDEMIA 04/21/2008  . HYPERTENSION 04/21/2008   Past Medical History  Diagnosis Date  . Diabetes     Past Surgical History  Procedure Laterality Date  . Shoulder [name] [name] with rotator cuff repair Right   . Knee [name] [name] w/ debridement Right      (Not in a hospital admission) No Known Allergies  History  Substance Use Topics  . Smoking status: Never Smoker   . Smokeless tobacco: Not on file  . Alcohol Use: Yes     Comment: 6 drinks a week    Family History  Problem Relation Age of Onset  . Breast cancer Mother   . Lymphoma Father    . Diabetes Father   . Heart disease Father 69    bypass surgery     Review of Systems  Constitutional: Negative.   HENT: Negative.   Eyes: Negative.   Respiratory: Negative.   Cardiovascular: Negative.   Gastrointestinal: Negative.   Genitourinary: Negative.   Musculoskeletal: Positive for joint pain.  Skin: Negative.   Neurological: Negative.   Endo/Heme/Allergies: Negative.   Psychiatric/Behavioral: Negative.     Objective:  Physical Exam  Constitutional: He is oriented to [name] [name], [name] [name] [name], [name] time. He appears well-developed [name] well-nourished.  HENT:  Head: Normocephalic [name] atraumatic.  Mouth/Throat: Oropharynx is clear [name] moist.  Eyes: Conjunctivae [name] EOM are normal. Pupils are equal, round, [name] reactive to light.  Neck: Normal range of motion. Neck supple.  Cardiovascular: Normal rate, regular rhythm [name] normal heart sounds.   Respiratory: Effort normal [name] breath sounds normal.  GI: Soft. Bowel sounds are normal.  Genitourinary:  Not pertinent to current symptomatology therefore not examined.  Musculoskeletal:  Examination of his right knee reveals pain medially [name] laterally, he has a well healed anteromedial incision, 1+ effusion moderate varus deformity, range of motion from -5 to 115 degrees knee is stable with normal patella tracking. Exam of the left knee reveals full range of motion without pain swelling weakness or instability. Vascular exam: pulses 2+ [name] symmetric.  Neurological: He is alert [name] oriented to [name] [name], [name] [name] [name], [name] time.  Skin: Skin is warm [name] dry.  Psychiatric: He has a normal mood [name] affect. His behavior is normal.    Vital signs in last 24 hours: @VSRANGES @  Labs:  Estimated body mass index is 27.62 kg/(m^2) as calculated from the following:   Height as of 09/15/13: 6\' 3"  (1.905 m).   Weight as of 09/15/13: 100.245 kg (221 lb).   Imaging Review Right knee standing AP lateral flexion [name] sunrise views show end stage degenerative  joint disease with retained ACL metal screws with bone on bone moderate varus deformity [name] periarticular osteophytes. Plain radiographs demonstrate severe degenerative joint disease of the right knee(s). The overall alignment issignificant varus. The bone quality appears to be good for age [name] reported activity level.  Assessment/Plan:  End stage arthritis, right knee   The patient history, physical examination, clinical judgment of the provider [name] imaging studies are consistent with end stage degenerative joint disease of the right knee(s) [name] total knee arthroplasty is deemed medically necessary. The treatment options including medical management, injection therapy [name] [name] [name] arthroplasty were discussed at length. The risks [name] benefits of total knee arthroplasty were presented [name] reviewed. The risks due to aseptic loosening, infection, stiffness, patella tracking problems, thromboembolic complications [name] other imponderables were discussed. The patient acknowledged the explanation, agreed to proceed with the plan [name] consent was signed. Patient is being admitted for inpatient treatment for surgery, pain control, PT, OT, prophylactic antibiotics, VTE prophylaxis, progressive ambulation [name] ADL's [name] discharge planning. The patient is planning to be discharged home with home health services  Apple Dearmas A. Kaleen Mask Physician Assistant [facility] [phone]  09/16/2013, 4:24 PM

## 2013-09-17 ENCOUNTER — Encounter (HOSPITAL_COMMUNITY): Payer: Self-pay

## 2013-09-17 ENCOUNTER — Encounter (HOSPITAL_COMMUNITY)
Admission: RE | Admit: 2013-09-17 | Discharge: 2013-09-17 | Disposition: A | Discharge status: Home / Self Care | Payer: Managed Care, Other (non HMO) | Source: Ambulatory Visit | Attending: Orthopedic Surgery | Admitting: Orthopedic Surgery

## 2013-09-17 ENCOUNTER — Encounter (HOSPITAL_COMMUNITY)
Admission: RE | Admit: 2013-09-17 | Discharge: 2013-09-17 | Disposition: A | Discharge status: Home / Self Care | Payer: Managed Care, Other (non HMO) | Source: Ambulatory Visit | Attending: Physician Assistant | Admitting: Physician Assistant

## 2013-09-17 DIAGNOSIS — Z01810 Encounter for preprocedural cardiovascular examination: Secondary | ICD-10-CM | POA: Insufficient documentation

## 2013-09-17 DIAGNOSIS — Z01818 Encounter for other preprocedural examination: Secondary | ICD-10-CM | POA: Insufficient documentation

## 2013-09-17 DIAGNOSIS — Z01812 Encounter for preprocedural laboratory examination: Secondary | ICD-10-CM | POA: Insufficient documentation

## 2013-09-17 HISTORY — DX: Essential (primary) hypertension: I10

## 2013-09-17 HISTORY — DX: Unspecified osteoarthritis, unspecified site: M19.90

## 2013-09-17 HISTORY — DX: Adverse effect of unspecified anesthetic, initial encounter: T41.45XA

## 2013-09-17 HISTORY — DX: Other complications of anesthesia, initial encounter: T88.59XA

## 2013-09-17 HISTORY — DX: Malignant (primary) neoplasm, unspecified: C80.1

## 2013-09-17 LAB — CBC WITH DIFFERENTIAL/PLATELET
BASOS PCT: 0 % (ref 0–1)
Basophils Absolute: 0 10*3/uL (ref 0.0–0.1)
EOS ABS: 0.1 10*3/uL (ref 0.0–0.7)
Eosinophils Relative: 2 % (ref 0–5)
HEMATOCRIT: 44.8 % (ref 39.0–52.0)
HEMOGLOBIN: 15.5 g/dL (ref 13.0–17.0)
Lymphocytes Relative: 24 % (ref 12–46)
Lymphs Abs: 1.5 10*3/uL (ref 0.7–4.0)
MCH: 31.6 pg (ref 26.0–34.0)
MCHC: 34.6 g/dL (ref 30.0–36.0)
MCV: 91.4 fL (ref 78.0–100.0)
MONO ABS: 0.5 10*3/uL (ref 0.1–1.0)
Monocytes Relative: 9 % (ref 3–12)
NEUTROS ABS: 4 10*3/uL (ref 1.7–7.7)
Neutrophils Relative %: 65 % (ref 43–77)
Platelets: 160 10*3/uL (ref 150–400)
RBC: 4.9 MIL/uL (ref 4.22–5.81)
RDW: 12.9 % (ref 11.5–15.5)
WBC: 6.2 10*3/uL (ref 4.0–10.5)

## 2013-09-17 LAB — URINALYSIS, ROUTINE W REFLEX MICROSCOPIC
Bilirubin Urine: NEGATIVE
HGB URINE DIPSTICK: NEGATIVE
KETONES UR: NEGATIVE mg/dL
Leukocytes, UA: NEGATIVE
Nitrite: NEGATIVE
Protein, ur: NEGATIVE mg/dL
Specific Gravity, Urine: 1.041 — ABNORMAL HIGH (ref 1.005–1.030)
Urobilinogen, UA: 0.2 mg/dL (ref 0.0–1.0)
pH: 5.5 (ref 5.0–8.0)

## 2013-09-17 LAB — URINE MICROSCOPIC-ADD ON

## 2013-09-17 LAB — TYPE AND SCREEN
ABO/RH(D): A POS
Antibody Screen: NEGATIVE

## 2013-09-17 LAB — PROTIME-INR
INR: 0.94 (ref 0.00–1.49)
Prothrombin Time: 12.4 seconds (ref 11.6–15.2)

## 2013-09-17 LAB — COMPREHENSIVE METABOLIC PANEL
ALBUMIN: 4.4 g/dL (ref 3.5–5.2)
ALT: 20 U/L (ref 0–53)
AST: 21 U/L (ref 0–37)
Alkaline Phosphatase: 53 U/L (ref 39–117)
BUN: 17 mg/dL (ref 6–23)
CHLORIDE: 104 meq/L (ref 96–112)
CO2: 24 mEq/L (ref 19–32)
Calcium: 9.5 mg/dL (ref 8.4–10.5)
Creatinine, Ser: 0.9 mg/dL (ref 0.50–1.35)
GFR calc Af Amer: >90 mL/min (ref >90)
Glucose, Bld: 197 mg/dL — ABNORMAL HIGH (ref 70–99)
Potassium: 4.3 mEq/L (ref 3.7–5.3)
Sodium: 143 mEq/L (ref 137–147)
Total Bilirubin: 0.6 mg/dL (ref 0.3–1.2)
Total Protein: 7.3 g/dL (ref 6.0–8.3)

## 2013-09-17 LAB — SURGICAL PCR SCREEN
MRSA, PCR: NEGATIVE
Staphylococcus aureus: NEGATIVE

## 2013-09-17 LAB — ABO/RH: ABO/RH(D): A POS

## 2013-09-17 LAB — HEMOGLOBIN A1C
Hgb A1c MFr Bld: 9.1 % — ABNORMAL HIGH (ref <5.7)
Mean Plasma Glucose: 214 mg/dL — ABNORMAL HIGH (ref <117)

## 2013-09-17 LAB — APTT: aPTT: 29 seconds (ref 24–37)

## 2013-09-17 MED ORDER — CHLORHEXIDINE GLUCONATE 4 % EX LIQD: 60.0000 mL | Freq: Once | CUTANEOUS | Status: DC

## 2013-09-17 NOTE — Progress Notes (Signed)
Stacy RN stated Dr Archie Endo office called and wanted patient's labs to be done stat please at PAT appointment. Labs drawn stat at 1500 PM appointment.

## 2013-09-17 NOTE — Pre-Procedure Instructions (Signed)
TROI FLORENDO  09/17/2013   Your procedure is scheduled on:  Monday April 6 th at 0900 Am  Report to [facility] Entr[facility] "A" at 0700 AM.  Call this number if you have problems the morning of surgery: [phone]   Remember:   Do not eat food or drink liquids after midnight.   Take these medicines the morning of surgery with A SIP OF WATER: Hydrocodone-acetaminophen   Do not wear jewelry.  Do not wear lotions, powders, or perfumes. You may wear deodorant.             Men may shave face and neck.  Do not bring valuables to the hospital.  [facility] is not responsible  for any belongings or valuables.               Contacts, dentures or bridgework may not be worn into surgery.  Leave suitcase in the car. After surgery it may be brought to your room.  For patients admitted to the hospital, discharge time is determined by your  treatment team.               Patients discharged the day of surgery will not be allowed to drive home.    Special Instructions: [facility] - Preparing for Surgery  Before surgery, you can play an important role.  Because skin is not sterile, your skin needs to be as free of germs as possible.  You can reduce the number of germs on you skin by washing with CHG (chlorahexidine gluconate) soap before surgery.  CHG is an antiseptic cleaner which kills germs and bonds with the skin to continue killing germs even after washing.  Please DO NOT use if you have an allergy to CHG or antibacterial soaps.  If your skin becomes reddened/irritated stop using the CHG and inform your nurse when you arrive at Short Stay.  Do not shave (including legs and underarms) for at least 48 hours prior to the first CHG shower.  You may shave your face.  Please follow these instructions carefully:   1.  Shower with CHG Soap the night before surgery and the                                morning of Surgery.  2.  If you choose to wash your hair, wash your hair first as usual with  your       normal shampoo.  3.  After you shampoo, rinse your hair and body thoroughly to remove the                      Shampoo.  4.  Use CHG as you would any other liquid soap.  You can apply chg directly       to the skin and wash gently with scrungie or a clean washcloth.  5.  Apply the CHG Soap to your body ONLY FROM THE NECK DOWN.        Do not use on open wounds or open sores.  Avoid contact with your eyes,       ears, mouth and genitals (private parts).  Wash genitals (private parts)       with your normal soap.  6.  Wash thoroughly, paying special attention to the area where your surgery        will be performed.  7.  Thoroughly rinse your body with warm  water from the neck down.  8.  DO NOT shower/wash with your normal soap after using and rinsing off       the CHG Soap.  9.  Pat yourself dry with a clean towel.            10.  Wear clean pajamas.            11.  Place clean sheets on your bed the night of your first shower and do not        sleep with pets.  Day of Surgery  Do not apply any lotions/deoderants the morning of surgery.  Please wear clean clothes to the hospital/surgery center.      Please read over the following fact sheets that you were given: Pain Booklet, Coughing and Deep Breathing, Blood Transfusion Information, MRSA Information and Surgical Site Infection Prevention

## 2013-09-18 LAB — URINE CULTURE
CULTURE: NO GROWTH
Colony Count: NO GROWTH

## 2013-09-20 MED ORDER — LACTATED RINGERS IV SOLN: INTRAVENOUS | Status: DC

## 2013-09-20 MED ORDER — CEFAZOLIN SODIUM-DEXTROSE 2-3 GM-% IV SOLR
2.0000 g | INTRAVENOUS | Status: AC
  Administered 2013-09-21: 2 g via INTRAVENOUS
  Filled 2013-09-20: qty 50

## 2013-09-20 MED ORDER — POVIDONE-IODINE 7.5 % EX SOLN
Freq: Once | CUTANEOUS | Status: DC
  Filled 2013-09-20: qty 118

## 2013-09-20 MED ORDER — CHLORHEXIDINE GLUCONATE 4 % EX LIQD
60.0000 mL | Freq: Once | CUTANEOUS | Status: DC
  Filled 2013-09-20: qty 60

## 2013-09-21 ENCOUNTER — Encounter (HOSPITAL_COMMUNITY): Payer: Managed Care, Other (non HMO) | Admitting: Anesthesiology

## 2013-09-21 ENCOUNTER — Encounter (HOSPITAL_COMMUNITY): Payer: Self-pay | Admitting: Anesthesiology

## 2013-09-21 ENCOUNTER — Encounter (HOSPITAL_COMMUNITY)
Admission: RE | Disposition: A | Discharge status: Home Health | Payer: Self-pay | Source: Ambulatory Visit | Attending: Orthopedic Surgery

## 2013-09-21 ENCOUNTER — Inpatient Hospital Stay (HOSPITAL_COMMUNITY)
Admission: RE | Admit: 2013-09-21 | Discharge: 2013-09-22 | DRG: 470 | Disposition: A | Discharge status: Home Health | Payer: Managed Care, Other (non HMO) | Source: Ambulatory Visit | Attending: Orthopedic Surgery | Admitting: Orthopedic Surgery

## 2013-09-21 ENCOUNTER — Inpatient Hospital Stay (HOSPITAL_COMMUNITY): Payer: Managed Care, Other (non HMO) | Admitting: Anesthesiology

## 2013-09-21 DIAGNOSIS — M179 Osteoarthritis of knee, unspecified: Secondary | ICD-10-CM | POA: Diagnosis present

## 2013-09-21 DIAGNOSIS — M1711 Unilateral primary osteoarthritis, right knee: Secondary | ICD-10-CM | POA: Diagnosis present

## 2013-09-21 DIAGNOSIS — E785 Hyperlipidemia, unspecified: Secondary | ICD-10-CM | POA: Diagnosis present

## 2013-09-21 DIAGNOSIS — Z472 Encounter for removal of internal fixation device: Secondary | ICD-10-CM

## 2013-09-21 DIAGNOSIS — M1990 Unspecified osteoarthritis, unspecified site: Secondary | ICD-10-CM | POA: Diagnosis present

## 2013-09-21 DIAGNOSIS — I10 Essential (primary) hypertension: Secondary | ICD-10-CM | POA: Diagnosis present

## 2013-09-21 DIAGNOSIS — E119 Type 2 diabetes mellitus without complications: Secondary | ICD-10-CM | POA: Diagnosis present

## 2013-09-21 DIAGNOSIS — Z87891 Personal history of nicotine dependence: Secondary | ICD-10-CM

## 2013-09-21 DIAGNOSIS — M1710 Unilateral primary osteoarthritis, unspecified knee: Principal | ICD-10-CM | POA: Diagnosis present

## 2013-09-21 HISTORY — PX: TOTAL KNEE ARTHROPLASTY: SHX125

## 2013-09-21 HISTORY — PX: HARDWARE REMOVAL: SHX979

## 2013-09-21 LAB — GLUCOSE, CAPILLARY
GLUCOSE-CAPILLARY: 133 mg/dL — AB (ref 70–99)
GLUCOSE-CAPILLARY: 154 mg/dL — AB (ref 70–99)
GLUCOSE-CAPILLARY: 279 mg/dL — AB (ref 70–99)
Glucose-Capillary: 237 mg/dL — ABNORMAL HIGH (ref 70–99)

## 2013-09-21 SURGERY — ARTHROPLASTY, KNEE, TOTAL
Anesthesia: Regional | Site: Knee | Laterality: Right

## 2013-09-21 MED ORDER — METOCLOPRAMIDE HCL 10 MG PO TABS: 5.0000 mg | ORAL_TABLET | Freq: Three times a day (TID) | ORAL | Status: DC | PRN

## 2013-09-21 MED ORDER — HYDROMORPHONE HCL PF 1 MG/ML IJ SOLN
0.2500 mg | INTRAMUSCULAR | Status: DC | PRN
  Administered 2013-09-21 (×4): 0.5 mg via INTRAVENOUS

## 2013-09-21 MED ORDER — LIDOCAINE HCL (CARDIAC) 20 MG/ML IV SOLN
INTRAVENOUS | Status: AC
  Filled 2013-09-21: qty 5

## 2013-09-21 MED ORDER — FENTANYL CITRATE 0.05 MG/ML IJ SOLN
INTRAMUSCULAR | Status: AC
  Filled 2013-09-21: qty 5

## 2013-09-21 MED ORDER — LIDOCAINE HCL (CARDIAC) 20 MG/ML IV SOLN
INTRAVENOUS | Status: DC | PRN
Start: 2013-09-21 — End: 2013-09-21
  Administered 2013-09-21: 100 mg via INTRAVENOUS

## 2013-09-21 MED ORDER — LACTATED RINGERS IV SOLN
INTRAVENOUS | Status: DC | PRN
  Administered 2013-09-21 (×2): via INTRAVENOUS

## 2013-09-21 MED ORDER — POTASSIUM CHLORIDE IN NACL 20-0.9 MEQ/L-% IV SOLN
INTRAVENOUS | Status: DC
  Administered 2013-09-21: 23:00:00 via INTRAVENOUS
  Filled 2013-09-21 (×5): qty 1000

## 2013-09-21 MED ORDER — ASPIRIN EC 325 MG PO TBEC
325.0000 mg | DELAYED_RELEASE_TABLET | Freq: Every day | ORAL | Status: DC
  Administered 2013-09-22: 325 mg via ORAL
  Filled 2013-09-21 (×2): qty 1

## 2013-09-21 MED ORDER — INSULIN ASPART 100 UNIT/ML ~~LOC~~ SOLN
4.0000 [IU] | Freq: Three times a day (TID) | SUBCUTANEOUS | Status: DC
  Administered 2013-09-21 – 2013-09-22 (×4): 4 [IU] via SUBCUTANEOUS

## 2013-09-21 MED ORDER — OXYCODONE HCL ER 20 MG PO T12A
20.0000 mg | EXTENDED_RELEASE_TABLET | Freq: Two times a day (BID) | ORAL | Status: DC
  Administered 2013-09-21 – 2013-09-22 (×3): 20 mg via ORAL
  Filled 2013-09-21 (×3): qty 2

## 2013-09-21 MED ORDER — OXYCODONE HCL 5 MG PO TABS
ORAL_TABLET | ORAL | Status: AC
  Administered 2013-09-21: 10 mg via ORAL
  Filled 2013-09-21: qty 2

## 2013-09-21 MED ORDER — INSULIN ASPART 100 UNIT/ML ~~LOC~~ SOLN
0.0000 [IU] | Freq: Three times a day (TID) | SUBCUTANEOUS | Status: DC
  Administered 2013-09-21: 7 [IU] via SUBCUTANEOUS
  Administered 2013-09-22: 15 [IU] via SUBCUTANEOUS
  Administered 2013-09-22: 4 [IU] via SUBCUTANEOUS
  Administered 2013-09-22: 11 [IU] via SUBCUTANEOUS

## 2013-09-21 MED ORDER — PROMETHAZINE HCL 25 MG/ML IJ SOLN: 6.2500 mg | INTRAMUSCULAR | Status: DC | PRN

## 2013-09-21 MED ORDER — OXYCODONE HCL 5 MG PO TABS: 5.0000 mg | ORAL_TABLET | Freq: Once | ORAL | Status: DC | PRN

## 2013-09-21 MED ORDER — DIPHENHYDRAMINE HCL 12.5 MG/5ML PO ELIX: 12.5000 mg | ORAL_SOLUTION | ORAL | Status: DC | PRN

## 2013-09-21 MED ORDER — COLESEVELAM HCL 625 MG PO TABS
1875.0000 mg | ORAL_TABLET | Freq: Two times a day (BID) | ORAL | Status: DC
  Administered 2013-09-21 – 2013-09-22 (×3): 1875 mg via ORAL
  Filled 2013-09-21 (×4): qty 3

## 2013-09-21 MED ORDER — ALUM & MAG HYDROXIDE-SIMETH 200-200-20 MG/5ML PO SUSP: 30.0000 mL | ORAL | Status: DC | PRN

## 2013-09-21 MED ORDER — DOCUSATE SODIUM 100 MG PO CAPS
100.0000 mg | ORAL_CAPSULE | Freq: Two times a day (BID) | ORAL | Status: DC
  Administered 2013-09-21 – 2013-09-22 (×3): 100 mg via ORAL
  Filled 2013-09-21 (×4): qty 1

## 2013-09-21 MED ORDER — ACETAMINOPHEN 500 MG PO TABS
1000.0000 mg | ORAL_TABLET | Freq: Four times a day (QID) | ORAL | Status: AC
  Administered 2013-09-21: 1000 mg via ORAL
  Administered 2013-09-21: 975 mg via ORAL
  Administered 2013-09-22 (×2): 1000 mg via ORAL
  Filled 2013-09-21 (×3): qty 2

## 2013-09-21 MED ORDER — PHENOL 1.4 % MT LIQD: 1.0000 | OROMUCOSAL | Status: DC | PRN

## 2013-09-21 MED ORDER — SUCCINYLCHOLINE CHLORIDE 20 MG/ML IJ SOLN
INTRAMUSCULAR | Status: DC | PRN
  Administered 2013-09-21: 100 mg via INTRAVENOUS

## 2013-09-21 MED ORDER — DEXAMETHASONE SODIUM PHOSPHATE 10 MG/ML IJ SOLN
INTRAMUSCULAR | Status: DC | PRN
  Administered 2013-09-21: 10 mg via INTRAVENOUS

## 2013-09-21 MED ORDER — BUPIVACAINE-EPINEPHRINE PF 0.5-1:200000 % IJ SOLN
INTRAMUSCULAR | Status: DC | PRN
  Administered 2013-09-21: 30 mL via PERINEURAL

## 2013-09-21 MED ORDER — ONDANSETRON HCL 4 MG/2ML IJ SOLN
INTRAMUSCULAR | Status: DC | PRN
Start: 2013-09-21 — End: 2013-09-21
  Administered 2013-09-21: 4 mg via INTRAVENOUS

## 2013-09-21 MED ORDER — BUPIVACAINE-EPINEPHRINE 0.25% -1:200000 IJ SOLN
INTRAMUSCULAR | Status: DC | PRN
  Administered 2013-09-21: 30 mL

## 2013-09-21 MED ORDER — METOCLOPRAMIDE HCL 5 MG/ML IJ SOLN: 5.0000 mg | Freq: Three times a day (TID) | INTRAMUSCULAR | Status: DC | PRN

## 2013-09-21 MED ORDER — PROPOFOL 10 MG/ML IV BOLUS
INTRAVENOUS | Status: AC
Start: 2013-09-21 — End: 2013-09-21
  Filled 2013-09-21: qty 20

## 2013-09-21 MED ORDER — ONDANSETRON HCL 4 MG PO TABS: 4.0000 mg | ORAL_TABLET | Freq: Four times a day (QID) | ORAL | Status: DC | PRN

## 2013-09-21 MED ORDER — LIVING WELL WITH DIABETES BOOK
Freq: Once | Status: AC
  Administered 2013-09-21: 1
  Filled 2013-09-21: qty 1

## 2013-09-21 MED ORDER — ONDANSETRON HCL 4 MG/2ML IJ SOLN
INTRAMUSCULAR | Status: AC
  Filled 2013-09-21: qty 2

## 2013-09-21 MED ORDER — FENTANYL CITRATE 0.05 MG/ML IJ SOLN
INTRAMUSCULAR | Status: AC
  Administered 2013-09-21: 100 ug via INTRAVENOUS
  Filled 2013-09-21: qty 2

## 2013-09-21 MED ORDER — CEFAZOLIN SODIUM-DEXTROSE 2-3 GM-% IV SOLR
2.0000 g | Freq: Four times a day (QID) | INTRAVENOUS | Status: AC
  Administered 2013-09-21 (×2): 2 g via INTRAVENOUS
  Filled 2013-09-21 (×2): qty 50

## 2013-09-21 MED ORDER — INSULIN ASPART 100 UNIT/ML ~~LOC~~ SOLN
0.0000 [IU] | Freq: Every day | SUBCUTANEOUS | Status: DC
  Administered 2013-09-21: 3 [IU] via SUBCUTANEOUS

## 2013-09-21 MED ORDER — ROCURONIUM BROMIDE 50 MG/5ML IV SOLN
INTRAVENOUS | Status: AC
  Filled 2013-09-21: qty 1

## 2013-09-21 MED ORDER — PROPOFOL 10 MG/ML IV BOLUS
INTRAVENOUS | Status: AC
  Filled 2013-09-21: qty 20

## 2013-09-21 MED ORDER — DEXAMETHASONE SODIUM PHOSPHATE 10 MG/ML IJ SOLN
INTRAMUSCULAR | Status: AC
  Filled 2013-09-21: qty 1

## 2013-09-21 MED ORDER — HYDROMORPHONE HCL PF 1 MG/ML IJ SOLN
INTRAMUSCULAR | Status: AC
  Administered 2013-09-21: 0.5 mg via INTRAVENOUS
  Filled 2013-09-21: qty 2

## 2013-09-21 MED ORDER — DEXAMETHASONE SODIUM PHOSPHATE 10 MG/ML IJ SOLN
10.0000 mg | Freq: Three times a day (TID) | INTRAMUSCULAR | Status: AC
  Administered 2013-09-21: 10 mg via INTRAVENOUS
  Filled 2013-09-21 (×3): qty 1

## 2013-09-21 MED ORDER — OXYCODONE HCL 5 MG/5ML PO SOLN: 5.0000 mg | Freq: Once | ORAL | Status: DC | PRN

## 2013-09-21 MED ORDER — OXYCODONE HCL 5 MG PO TABS
5.0000 mg | ORAL_TABLET | ORAL | Status: DC | PRN
  Administered 2013-09-21 – 2013-09-22 (×2): 10 mg via ORAL
  Filled 2013-09-21: qty 2

## 2013-09-21 MED ORDER — VARENICLINE TARTRATE 0.5 MG PO TABS
0.5000 mg | ORAL_TABLET | Freq: Two times a day (BID) | ORAL | Status: DC
  Administered 2013-09-22: 0.5 mg via ORAL
  Filled 2013-09-21 (×4): qty 1

## 2013-09-21 MED ORDER — BISACODYL 5 MG PO TBEC
10.0000 mg | DELAYED_RELEASE_TABLET | Freq: Every day | ORAL | Status: DC
  Administered 2013-09-21: 10 mg via ORAL
  Filled 2013-09-21: qty 2

## 2013-09-21 MED ORDER — HYDROMORPHONE HCL PF 1 MG/ML IJ SOLN
1.0000 mg | INTRAMUSCULAR | Status: DC | PRN
  Administered 2013-09-21 (×2): 1 mg via INTRAVENOUS
  Filled 2013-09-21: qty 1
  Filled 2013-09-21: qty 2

## 2013-09-21 MED ORDER — ONDANSETRON HCL 4 MG/2ML IJ SOLN: 4.0000 mg | Freq: Four times a day (QID) | INTRAMUSCULAR | Status: DC | PRN

## 2013-09-21 MED ORDER — DEXAMETHASONE 6 MG PO TABS
10.0000 mg | ORAL_TABLET | Freq: Three times a day (TID) | ORAL | Status: AC
  Administered 2013-09-21 – 2013-09-22 (×2): 10 mg via ORAL
  Filled 2013-09-21 (×3): qty 1

## 2013-09-21 MED ORDER — PROPOFOL 10 MG/ML IV BOLUS
INTRAVENOUS | Status: DC | PRN
  Administered 2013-09-21: 50 mg via INTRAVENOUS
  Administered 2013-09-21: 200 mg via INTRAVENOUS

## 2013-09-21 MED ORDER — SODIUM CHLORIDE 0.9 % IR SOLN
Status: DC | PRN
  Administered 2013-09-21: 3000 mL

## 2013-09-21 MED ORDER — MIDAZOLAM HCL 2 MG/2ML IJ SOLN
INTRAMUSCULAR | Status: AC
  Filled 2013-09-21: qty 2

## 2013-09-21 MED ORDER — ROCURONIUM BROMIDE 50 MG/5ML IV SOLN
INTRAVENOUS | Status: AC
Start: 2013-09-21 — End: 2013-09-21
  Filled 2013-09-21: qty 1

## 2013-09-21 MED ORDER — ACETAMINOPHEN 325 MG PO TABS
ORAL_TABLET | ORAL | Status: AC
Start: 2013-09-21 — End: 2013-09-21
  Administered 2013-09-21: 975 mg via ORAL
  Filled 2013-09-21: qty 3

## 2013-09-21 MED ORDER — CELECOXIB 200 MG PO CAPS
200.0000 mg | ORAL_CAPSULE | Freq: Two times a day (BID) | ORAL | Status: DC
  Administered 2013-09-21 – 2013-09-22 (×3): 200 mg via ORAL
  Filled 2013-09-21 (×4): qty 1

## 2013-09-21 MED ORDER — MENTHOL 3 MG MT LOZG: 1.0000 | LOZENGE | OROMUCOSAL | Status: DC | PRN

## 2013-09-21 MED ORDER — FENTANYL CITRATE 0.05 MG/ML IJ SOLN
INTRAMUSCULAR | Status: DC | PRN
  Administered 2013-09-21: 100 ug via INTRAVENOUS
  Administered 2013-09-21 (×3): 50 ug via INTRAVENOUS

## 2013-09-21 MED ORDER — INSULIN GLARGINE 100 UNIT/ML ~~LOC~~ SOLN
10.0000 [IU] | Freq: Every day | SUBCUTANEOUS | Status: DC
  Administered 2013-09-21: 10 [IU] via SUBCUTANEOUS
  Filled 2013-09-21 (×2): qty 0.1

## 2013-09-21 MED ORDER — MIDAZOLAM HCL 2 MG/2ML IJ SOLN
INTRAMUSCULAR | Status: AC
  Administered 2013-09-21: 2 mg via INTRAVENOUS
  Filled 2013-09-21: qty 2

## 2013-09-21 MED ORDER — BUPIVACAINE-EPINEPHRINE (PF) 0.25% -1:200000 IJ SOLN
INTRAMUSCULAR | Status: AC
  Filled 2013-09-21: qty 30

## 2013-09-21 SURGICAL SUPPLY — 70 items
BANDAGE ELASTIC 6 VELCRO ST LF (GAUZE/BANDAGES/DRESSINGS) ×3 IMPLANT
BANDAGE ESMARK 6X9 LF (GAUZE/BANDAGES/DRESSINGS) ×1 IMPLANT
BLADE SAGITTAL 25.0X1.19X90 (BLADE) ×2 IMPLANT
BLADE SAGITTAL 25.0X1.19X90MM (BLADE) ×1
BLADE SAW SGTL 11.0X1.19X90.0M (BLADE) IMPLANT
BLADE SAW SGTL 13.0X1.19X90.0M (BLADE) ×3 IMPLANT
BLADE SURG 10 STRL SS (BLADE) ×6 IMPLANT
BNDG ELASTIC 6X15 VLCR STRL LF (GAUZE/BANDAGES/DRESSINGS) ×3 IMPLANT
BNDG ESMARK 6X9 LF (GAUZE/BANDAGES/DRESSINGS) ×3
BOWL SMART MIX CTS (DISPOSABLE) ×3 IMPLANT
CAPT RP KNEE ×3 IMPLANT
CEMENT HV SMART SET (Cement) ×6 IMPLANT
CLOSURE STERI-STRIP 1/2X4 (GAUZE/BANDAGES/DRESSINGS) ×1
CLOSURE WOUND 1/2 X4 (GAUZE/BANDAGES/DRESSINGS) ×1
CLSR STERI-STRIP ANTIMIC 1/2X4 (GAUZE/BANDAGES/DRESSINGS) ×2 IMPLANT
COVER SURGICAL LIGHT HANDLE (MISCELLANEOUS) ×3 IMPLANT
CUFF TOURNIQUET SINGLE 34IN LL (TOURNIQUET CUFF) ×3 IMPLANT
CUFF TOURNIQUET SINGLE 44IN (TOURNIQUET CUFF) IMPLANT
DRAPE EXTREMITY T 121X128X90 (DRAPE) ×3 IMPLANT
DRAPE INCISE IOBAN 66X45 STRL (DRAPES) ×3 IMPLANT
DRAPE PROXIMA HALF (DRAPES) ×3 IMPLANT
DRAPE U-SHAPE 47X51 STRL (DRAPES) ×3 IMPLANT
DRSG ADAPTIC 3X8 NADH LF (GAUZE/BANDAGES/DRESSINGS) ×3 IMPLANT
DRSG PAD ABDOMINAL 8X10 ST (GAUZE/BANDAGES/DRESSINGS) ×3 IMPLANT
DURAPREP 26ML APPLICATOR (WOUND CARE) ×6 IMPLANT
ELECT CAUTERY BLADE 6.4 (BLADE) ×3 IMPLANT
ELECT REM PT RETURN 9FT ADLT (ELECTROSURGICAL) ×3
ELECTRODE REM PT RTRN 9FT ADLT (ELECTROSURGICAL) ×1 IMPLANT
EVACUATOR 1/8 PVC DRAIN (DRAIN) ×3 IMPLANT
FACESHIELD WRAPAROUND (MASK) ×3 IMPLANT
GLOVE BIO SURGEON STRL SZ7 (GLOVE) ×3 IMPLANT
GLOVE BIOGEL PI IND STRL 7.0 (GLOVE) ×1 IMPLANT
GLOVE BIOGEL PI IND STRL 7.5 (GLOVE) ×1 IMPLANT
GLOVE BIOGEL PI INDICATOR 7.0 (GLOVE) ×2
GLOVE BIOGEL PI INDICATOR 7.5 (GLOVE) ×2
GLOVE SS BIOGEL STRL SZ 7.5 (GLOVE) ×1 IMPLANT
GLOVE SUPERSENSE BIOGEL SZ 7.5 (GLOVE) ×2
GOWN STRL REUS W/ TWL LRG LVL3 (GOWN DISPOSABLE) ×2 IMPLANT
GOWN STRL REUS W/ TWL XL LVL3 (GOWN DISPOSABLE) ×2 IMPLANT
GOWN STRL REUS W/TWL LRG LVL3 (GOWN DISPOSABLE) ×4
GOWN STRL REUS W/TWL XL LVL3 (GOWN DISPOSABLE) ×4
HANDPIECE INTERPULSE COAX TIP (DISPOSABLE) ×2
HOOD PEEL AWAY FACE SHEILD DIS (HOOD) ×6 IMPLANT
IMMOBILIZER KNEE 22 UNIV (SOFTGOODS) IMPLANT
KIT BASIN OR (CUSTOM PROCEDURE TRAY) ×3 IMPLANT
KIT ROOM TURNOVER OR (KITS) ×3 IMPLANT
MANIFOLD NEPTUNE II (INSTRUMENTS) ×3 IMPLANT
NS IRRIG 1000ML POUR BTL (IV SOLUTION) ×3 IMPLANT
PACK TOTAL JOINT (CUSTOM PROCEDURE TRAY) ×3 IMPLANT
PAD ARMBOARD 7.5X6 YLW CONV (MISCELLANEOUS) ×6 IMPLANT
PAD CAST 4YDX4 CTTN HI CHSV (CAST SUPPLIES) ×1 IMPLANT
PADDING CAST COTTON 4X4 STRL (CAST SUPPLIES) ×2
PADDING CAST COTTON 6X4 STRL (CAST SUPPLIES) ×3 IMPLANT
RUBBERBAND STERILE (MISCELLANEOUS) ×3 IMPLANT
SET HNDPC FAN SPRY TIP SCT (DISPOSABLE) ×1 IMPLANT
SPONGE GAUZE 4X4 12PLY (GAUZE/BANDAGES/DRESSINGS) ×3 IMPLANT
SPONGE GAUZE 4X4 12PLY STER LF (GAUZE/BANDAGES/DRESSINGS) ×3 IMPLANT
STRIP CLOSURE SKIN 1/2X4 (GAUZE/BANDAGES/DRESSINGS) ×2 IMPLANT
SUCTION FRAZIER TIP 10 FR DISP (SUCTIONS) ×3 IMPLANT
SUT ETHIBOND NAB CT1 #1 30IN (SUTURE) ×6 IMPLANT
SUT MNCRL AB 3-0 PS2 18 (SUTURE) ×3 IMPLANT
SUT VIC AB 0 CT1 27 (SUTURE) ×4
SUT VIC AB 0 CT1 27XBRD ANBCTR (SUTURE) ×2 IMPLANT
SUT VIC AB 2-0 CT1 27 (SUTURE) ×4
SUT VIC AB 2-0 CT1 TAPERPNT 27 (SUTURE) ×2 IMPLANT
SYR 30ML SLIP (SYRINGE) ×3 IMPLANT
TOWEL OR 17X24 6PK STRL BLUE (TOWEL DISPOSABLE) ×3 IMPLANT
TOWEL OR 17X26 10 PK STRL BLUE (TOWEL DISPOSABLE) ×3 IMPLANT
TRAY FOLEY CATH 16FR SILVER (SET/KITS/TRAYS/PACK) ×3 IMPLANT
WATER STERILE IRR 1000ML POUR (IV SOLUTION) ×6 IMPLANT

## 2013-09-21 NOTE — Evaluation (Signed)
Physical Therapy Evaluation Patient Details Name: [name] [name] MRN: [id number] DOB: [birth year] Today's Date: 09/21/2013   History of Present Illness  Patient is a 57 yo male s/p Rt TKA.  Clinical Impression  Patient presents with problems listed below.  Will benefit from acute PT to maximize independence prior to discharge home with family.    Follow Up Recommendations Home health PT;Supervision/Assistance - 24 hour    Equipment Recommendations  None recommended by PT    Recommendations for Other Services       Precautions / Restrictions Precautions Precautions: Knee Precaution Booklet Issued: Yes (comment) Precaution Comments: Reviewed precautions with patient. Required Braces or Orthoses: Knee Immobilizer - Right Knee Immobilizer - Right: On when out of bed or walking Restrictions Weight Bearing Restrictions: Yes RLE Weight Bearing: Weight bearing as tolerated      Mobility  Bed Mobility Overal bed mobility: Needs Assistance Bed Mobility: Supine to Sit     Supine to sit: Supervision        Transfers Overall transfer level: Needs assistance Equipment used: Rolling walker (2 wheeled) Transfers: Sit to/from Stand Sit to Stand: Min guard         General transfer comment: Instructed patient on donning KI on RLE while in recliner.  Verbal cues for hand placement and technique for transfers.  Assist for safety/balance only.  Ambulation/Gait Ambulation/Gait assistance: Min guard Ambulation Distance (Feet): 82 Feet Assistive device: Rolling walker (2 wheeled) Gait Pattern/deviations: Step-to pattern;Decreased stance time - right;Decreased step length - left;Antalgic Gait velocity: Slow Gait velocity interpretation: Below normal speed for age/gender General Gait Details: Verbal cues for safe use of RW and gait sequence.  Cues to keep RW closer to himself and take shorter step with RLE.  Cues to remain upright during gait.  Stairs            Wheelchair  Mobility    Modified Rankin (Stroke Patients Only)       Balance                                             Pertinent Vitals/Pain     Home Living Family/patient expects to be discharged to:: Private residence Living Arrangements: Spouse/significant other;Children Available Help at Discharge: Family;Available 24 hours/day Type of Home: House Home Access: Stairs to enter Entrance Stairs-Rails: Right;Left Entrance Stairs-Number of Steps: approximately 4 Home Layout: Two level;Able to live on main level with bedroom/bathroom Home Equipment: Gilford Rile - 2 wheels;Walker - 4 wheels;Bedside commode      Prior Function Level of Independence: Independent               Hand Dominance        Extremity/Trunk Assessment   Upper Extremity Assessment: Overall WFL for tasks assessed           Lower Extremity Assessment: RLE deficits/detail RLE Deficits / Details: Decreased strength and ROM due to surgery/pain.  Patient able to assist with lifting RLE to don KI.    Cervical / Trunk Assessment: Normal  Communication   Communication: No difficulties  Cognition Arousal/Alertness: Awake/alert Behavior During Therapy: WFL for tasks assessed/performed Overall Cognitive Status: Within Functional Limits for tasks assessed                      General Comments      Exercises Total Joint Exercises Ankle Circles/Pumps: AROM;Both;10 reps;Seated  Quad Sets: AROM;Right;5 reps;Seated      Assessment/Plan    PT Assessment Patient needs continued PT services  PT Diagnosis Difficulty walking;Acute pain   PT Problem List Decreased strength;Decreased range of motion;Decreased activity tolerance;Decreased balance;Decreased mobility;Decreased knowledge of use of DME;Decreased knowledge of precautions;Pain  PT Treatment Interventions DME instruction;Gait training;Stair training;Functional mobility training;Therapeutic exercise;Patient/family education   PT  Goals (Current goals can be found in the Care Plan section) Acute Rehab PT Goals Patient Stated Goal: To go home tomorrow PT Goal Formulation: With patient Time For Goal Achievement: 09/28/13 Potential to Achieve Goals: Good    Frequency 7X/week   Barriers to discharge        Co-evaluation               End of Session Equipment Utilized During Treatment: Gait belt;Right knee immobilizer Activity Tolerance: Patient tolerated treatment well;Patient limited by pain Patient left: in chair;with call bell/phone within reach Nurse Communication: Mobility status         Time: 2703-5009 PT Time Calculation (min): 22 min   Charges:   PT Evaluation $Initial PT Evaluation Tier I: 1 Procedure PT Treatments $Gait Training: 8-22 mins   PT G CodesDespina Pole 09/21/2013, 7:32 PM Carita Pian. Sanjuana Kava, [place] Pager [phone]

## 2013-09-21 NOTE — Anesthesia Procedure Notes (Addendum)
Anesthesia Regional Block:  Femoral nerve block  Pre-Anesthetic Checklist: ,, timeout performed, Correct Patient, Correct Site, Correct Laterality, Correct Procedure, Correct Position, site marked, Risks and benefits discussed, at surgeon's request and post-op pain management  Laterality: Right and Lower  Prep: chloraprep       Needles:  Injection technique: Single-shot  Needle Type: Stimulator Needle - 80     Needle Length: 9cm 9 cm Needle Gauge: 22 and 22 G  Needle insertion depth: 6 cm   Additional Needles:  Procedures: ultrasound guided (picture in chart) and nerve stimulator Femoral nerve block  Nerve Stimulator or Paresthesia:  Response: Twitch elicited, 0.8 mA,   Additional Responses:   Narrative:  Start time: 09/21/2013 8:05 AM End time: 09/21/2013 8:20 AM  Performed by: Personally  Anesthesiologist: Bartolo Darter, MD  Additional Notes: BP cuff, EKG monitors applied. Sedation begun. Femoral artery palpated for location of nerve. After nerve location anesthetic injected incrementally, slowly , and after neg aspirations. Tolerated well.   Procedure Name: LMA Insertion Date/Time: 09/21/2013 8:56 AM Performed by: Scheryl Darter Pre-anesthesia Checklist: Patient identified, Timeout performed, Emergency Drugs available, Suction available and Patient being monitored Patient Re-evaluated:Patient Re-evaluated prior to inductionOxygen Delivery Method: Circle system utilized Preoxygenation: Pre-oxygenation with 100% oxygen Intubation Type: IV induction LMA: LMA inserted LMA Size: 5.0 Number of attempts: 1 Placement Confirmation: positive ETCO2 and breath sounds checked- equal and bilateral Tube secured with: Tape Dental Injury: Teeth and Oropharynx as per pre-operative assessment

## 2013-09-21 NOTE — Anesthesia Postprocedure Evaluation (Signed)
  Anesthesia Post-op Note  Patient: [name] [name]  Procedure(s) Performed: Procedure(s): TOTAL KNEE ARTHROPLASTY (Right) HARDWARE REMOVAL (Right)  Patient Location: PACU  Anesthesia Type:GA combined with regional for post-op pain  Level of Consciousness: awake and alert   Airway and Oxygen Therapy: Patient Spontanous Breathing  Post-op Pain: mild  Post-op Assessment: Post-op Vital signs reviewed  Post-op Vital Signs: stable  Complications: No apparent anesthesia complications

## 2013-09-21 NOTE — Evaluation (Signed)
Occupational Therapy Evaluation Patient Details Name: [name] [name] MRN: [id number] DOB: Mar 03, 1956 Today's Date: 09/21/2013    History of Present Illness 58 y.o. s/p TOTAL KNEE ARTHROPLASTY (Right)    Clinical Impression   Pt moving well during session. Education provided to pt and family present. Feel pt is safe to d/c home, from OT standpoint, with family available to assist.     Follow Up Recommendations  No OT follow up;Supervision - Intermittent    Equipment Recommendations  None recommended by OT    Recommendations for Other Services       Precautions / Restrictions Precautions Precautions: Fall;Knee Precaution Comments: Reviewed precautions with pt Required Braces or Orthoses: Knee Immobilizer - Right Restrictions Weight Bearing Restrictions: Yes RLE Weight Bearing: Weight bearing as tolerated      Mobility Bed Mobility Overal bed mobility: Needs Assistance Bed Mobility: Supine to Sit     Supine to sit: Supervision        Transfers Overall transfer level: Needs assistance Equipment used: Rolling walker (2 wheeled) Transfers: Sit to/from Stand Sit to Stand: Min guard         General transfer comment: Min guard for safety; cues for hand placement/technique.    Balance                                            ADL Overall ADL's : Needs assistance/impaired                 Upper Body Dressing : Set up;Sitting   Lower Body Dressing: Min guard;Sit to/from stand   Toilet Transfer: Min guard;Minimal assistance;Ambulation (chair)           Functional mobility during ADLs: Min guard;Minimal assistance;Rolling walker;Cueing for sequencing General ADL Comments: Pt moving well during session. Educated on use of bag on walker to carry items. Recommended someone pick up rugs in house and discussed safe shoewear. Recommended sitting for bathing and dressing. OT demonstrated shower transfer technique and pt reports he feels  good with this. Recommended having someone with him for shower transfer. Educated on benefit of reaching to right foot as it increases ROM in knee as he was donning/doffing sock. Educated that if LB dressing/bathing becomes difficult AE is available for this. Educated on footsie roll and knee immobilizer.      Vision                     Perception     Praxis      Pertinent Vitals/Pain Pain 5/10. Increased activity during session. Repositioned in footsie roll at end of session.      Hand Dominance     Extremity/Trunk Assessment Upper Extremity Assessment Upper Extremity Assessment: Overall WFL for tasks assessed   Lower Extremity Assessment Lower Extremity Assessment: Defer to PT evaluation       Communication Communication Communication: No difficulties   Cognition Arousal/Alertness: Awake/alert Behavior During Therapy: WFL for tasks assessed/performed Overall Cognitive Status: Within Functional Limits for tasks assessed                     General Comments       Exercises       Shoulder Instructions      Home Living Family/patient expects to be discharged to:: Private residence Living Arrangements: Spouse/significant other;Children Available Help at Discharge: Family;Available 24 hours/day (daughter home for  one week) Type of Home: House Home Access: Stairs to enter [employer] of Steps: approximately 4 Entrance Stairs-Rails: Right;Left Home Layout: Two level;Able to live on main level with bedroom/bathroom (no shower on main level)     Bathroom Shower/Tub: Walk-in shower;Door   Bathroom Toilet:  (has [place])     Home Equipment: Walker - 2 wheels;Walker - 4 wheels;Bedside commode          Prior Functioning/Environment Level of Independence: Independent             OT Diagnosis:     OT Problem List:     OT Treatment/Interventions:      OT Goals(Current goals can be found in the care plan section)    OT Frequency:      Barriers to D/C:            Co-evaluation              End of Session Equipment Utilized During Treatment: Gait belt;Rolling walker;Right knee immobilizer (O2 placed back on at end) CPM Right Knee CPM Right Knee: Off Nurse Communication: Mobility status  Activity Tolerance: Patient tolerated treatment well Patient left: in chair;with call bell/phone within reach;with family/visitor present   Time: 3419-6222 OT Time Calculation (min): 35 min Charges:  OT General Charges $OT Visit: 1 Procedure OT Evaluation $Initial OT Evaluation Tier I: 1 Procedure OT Treatments $Self Care/Home Management : 8-22 mins G-CodesBenito Mccreedy OTR/L [phone] 09/21/2013, 4:57 PM

## 2013-09-21 NOTE — Op Note (Signed)
MRN:     [id number] DOB/AGE:    12/13/1955 / 58 y.o.       OPERATIVE REPORT    DATE OF PROCEDURE:  09/21/2013       PREOPERATIVE DIAGNOSIS:   DJD RIGHT KNEE      Estimated body mass index is 28.6 kg/(m^2) as calculated from the following:   Height as of 09/17/13: 6\' 2"  (1.88 m).   Weight as of this encounter: 101.095 kg (222 lb 14 oz).                                                        POSTOPERATIVE DIAGNOSIS:   DJD RIGHT KNEE, retained hardware right knee                                                                      PROCEDURE:  Procedure(s): TOTAL KNEE ARTHROPLASTY HARDWARE REMOVAL Using Depuy Sigma RP implants #5 Femur, #5Tibia, 56mm sigma RP bearing, 38 Patella Removal of retained hardware right knee     SURGEON: Loyde Orth A    ASSISTANT:  Kirstin Shepperson PA-C   (Present and scrubbed throughout the case, critical for assistance with exposure, retraction, instrumentation, and closure.)         ANESTHESIA: GET with Femoral Nerve Block  DRAINS: foley, 2 medium hemovac in knee   TOURNIQUET TIME: 07PXT   COMPLICATIONS:  None     SPECIMENS: None   INDICATIONS FOR PROCEDURE: The patient has  DJD RIGHT KNEE, varus deformities, XR shows bone on bone arthritis. Patient has failed all conservative measures including anti-inflammatory medicines, narcotics, attempts at  exercise and weight loss, cortisone injections and viscosupplementation.  Risks and benefits of surgery have been discussed, questions answered.   DESCRIPTION OF PROCEDURE: The patient identified by armband, received  right femoral nerve block and IV antibiotics, in the holding area at [facility]. Patient taken to the operating room, appropriate anesthetic  monitors were attached General endotracheal anesthesia induced with  the patient in supine position, Foley catheter was inserted. Tourniquet  applied high to the operative thigh. Lateral post and foot positioner  applied to the table, the lower  extremity was then prepped and draped  in usual sterile fashion from the ankle to the tourniquet. Time-out procedure was performed. The limb was wrapped with an Esmarch bandage and the tourniquet inflated to 365 mmHg. We began the operation by making the anterior midline incision starting at handbreadth above the patella going over the patella 1 cm medial to and  4 cm distal to the tibial tubercle. Small bleeders in the skin and the  subcutaneous tissue identified and cauterized. Transverse retinaculum was incised and reflected medially and a medial parapatellar arthrotomy was accomplished. the patella was everted and theprepatellar fat pad resected. The superficial medial collateral  ligament was then elevated from anterior to posterior along the proximal  flare of the tibia and anterior half of the menisci resected. The knee was hyperflexed exposing bone on bone arthritis. Peripheral and notch osteophytes as well as the cruciate ligaments were then resected. We continued  to  work our way around posteriorly along the proximal tibia, and externally  rotated the tibia subluxing it out from underneath the femur. The retained ACL screw in the anteromedial tibia was then carefully exposed and removed with a screw driver. There was a similar screw in the femur that was not removed because it was not in the way of all of the femoral cuts.  A McHale  retractor was placed through the notch and a lateral Hohmann retractor  placed, and we then drilled through the proximal tibia in line with the  axis of the tibia followed by an intramedullary guide rod and 2-degree  posterior slope cutting guide. The tibial cutting guide was pinned into place  allowing resection of 4 mm of bone medially and about 6 mm of bone  laterally because of her varus deformity. Satisfied with the tibial resection, we then  entered the distal femur 2 mm anterior to the PCL origin with the  intramedullary guide rod and applied the distal  femoral cutting guide  set at 38mm, with 5 degrees of valgus. This was pinned along the  epicondylar axis. At this point, the distal femoral cut was accomplished without difficulty. We then sized for a #5 femoral component and pinned the guide in 3 degrees of external rotation.The chamfer cutting guide was pinned into place. The anterior, posterior, and chamfer cuts were accomplished without difficulty followed by  the Sigma RP box cutting guide and the box cut. We also removed posterior osteophytes from the posterior femoral condyles. At this  time, the knee was brought into full extension. We checked our  extension and flexion gaps and found them symmetric at 61mm.  The patella thickness measured at 28 mm. We set the cutting guide at 18 and removed the posterior 9.5-10 mm  of the patella sized for 38 button and drilled the lollipop. The knee  was then once again hyperflexed exposing the proximal tibia. We sized for a #5 tibial base plate, applied the smokestack and the conical reamer followed by the the Delta fin keel punch. We then hammered into place the Sigma RP trial femoral component, inserted a 10-mm trial bearing, trial patellar button, and took the knee through range of motion from 0-130 degrees. No thumb pressure was required for patellar  tracking. At this point, all trial components were removed, a double batch of DePuy HV cement  was mixed and applied to all bony metallic mating surfaces except for the posterior condyles of the femur itself. In order, we  hammered into place the tibial tray and removed excess cement, the femoral component and removed excess cement, a 10-mm Sigma RP bearing  was inserted, and the knee brought to full extension with compression.  The patellar button was clamped into place, and excess cement  removed. While the cement cured the wound was irrigated out with normal saline solution pulse lavage, and medium Hemovac drains were placed.. Ligament stability and  patellar tracking were checked and found to be excellent. The tourniquet was then released and hemostasis was obtained with cautery. The parapatellar arthrotomy was closed with  #1 ethibond suture. The subcutaneous tissue with 0 and 2-0 undyed  Vicryl suture, and 4-0 Monocryl.. A dressing of Xeroform,  4 x 4, dressing sponges, Webril, and Ace wrap applied. Needle and sponge count were correct times 2.The patient awakened, extubated, and taken to recovery room without difficulty. Vascular status was normal, pulses 2+ and symmetric.   Janeen Watson A 09/21/2013, 10:11 AM

## 2013-09-21 NOTE — Care Management Note (Signed)
CARE MANAGEMENT NOTE 09/21/2013  Patient:  [name] [name], [name] [name]   Account Number:  [id number]  Date Initiated:  09/21/2013  Documentation initiated by:  Ricki Miller  Subjective/Objective Assessment:   58 yr old male admitted with DJD right knee, s/p right total knee arthroplasty.     Action/Plan:   PT/OT eval   Anticipated DC Date:  09/22/2013   Anticipated DC Plan:  [place]  CM consult      PAC Choice  [place]   Choice offered to / List presented to:             Status of service:  In process, will continue to follow

## 2013-09-21 NOTE — Anesthesia Preprocedure Evaluation (Signed)
Anesthesia Evaluation  Patient identified by MRN, date of birth, ID band Patient awake    Reviewed: Allergy & Precautions, H&P , NPO status , Patient's Chart, lab work & pertinent test results  Airway       Dental   Pulmonary former smoker,          Cardiovascular hypertension,     Neuro/Psych    GI/Hepatic   Endo/Other  diabetes  Renal/GU      Musculoskeletal   Abdominal   Peds  Hematology   Anesthesia Other Findings   Reproductive/Obstetrics                           Anesthesia Physical Anesthesia Plan  ASA: III  Anesthesia Plan: General   Post-op Pain Management:    Induction: Intravenous  Airway Management Planned: Oral ETT  Additional Equipment:   Intra-op Plan:   Post-operative Plan: Extubation in OR  Informed Consent: I have reviewed the patients History and Physical, chart, labs and discussed the procedure including the risks, benefits and alternatives for the proposed anesthesia with the patient or authorized representative who has indicated his/her understanding and acceptance.     Plan Discussed with: CRNA and Surgeon  Anesthesia Plan Comments:         Anesthesia Quick Evaluation

## 2013-09-21 NOTE — Addendum Note (Signed)
Addendum created 09/21/13 1356 by Scheryl Darter, CRNA   Modules edited: Anesthesia Flowsheet

## 2013-09-21 NOTE — Progress Notes (Signed)
Orthopedic Tech Progress Note Patient Details:  [name] [name] Jan 11, 1956 [id number] CPM applied to Right LE with appropriate settings. OHF applied to bed. Footsie roll provided.  CPM Right Knee CPM Right Knee: On Right Knee Flexion (Degrees): 60 Right Knee Extension (Degrees): 0   Asia R Thompson 09/21/2013, 11:47 AM

## 2013-09-21 NOTE — Transfer of Care (Signed)
Immediate Anesthesia Transfer of Care Note  Patient: [name] [name]  Procedure(s) Performed: Procedure(s): TOTAL KNEE ARTHROPLASTY (Right) HARDWARE REMOVAL (Right)  Patient Location: PACU  Anesthesia Type:General and Regional  Level of Consciousness: awake, alert , oriented and sedated  Airway & Oxygen Therapy: Patient Spontanous Breathing and Patient connected to nasal cannula oxygen  Post-op Assessment: Report given to PACU RN, Post -op Vital signs reviewed and stable and Patient moving all extremities  Post vital signs: Reviewed and stable  Complications: No apparent anesthesia complications

## 2013-09-21 NOTE — Progress Notes (Signed)
Inpatient Diabetes Program Recommendations  AACE/ADA: New Consensus Statement on Inpatient Glycemic Control (2013)  Target Ranges:  Prepandial:   less than 140 mg/dL      Peak postprandial:   less than 180 mg/dL (1-2 hours)      Critically ill patients:  140 - 180 mg/dL   Reason for Assessment: Consult call from ortho PA regarding glycemic control  Diabetes history: DM2 Outpatient Diabetes medications: Canagliflozen, 300 mg QD, Januvia 100 mg QD, and metformin 1000 mg bid Current orders for Inpatient glycemic control: Lantus 10 QHS, Novolog resistant tidwc and hs and Novolog 4 units tidwc  Results for MIKIE, MISNER (MRN [id number]) as of 09/21/2013 11:09  Ref. Range 09/21/2013 07:48  Glucose-Capillary Latest Range: 70-99 mg/dL 133 (H)  Results for GEOVONNI, MEYERHOFF (MRN [id number]) as of 09/21/2013 11:09  Ref. Range 09/17/2013 14:03  Hemoglobin A1C Latest Range: <5.7 % 9.1 (H)    Pt s/p TKR in PACU at present. PA to order above for inpatient orders.  HgbA1C - 9.1%. Sees Dr. Loanne Drilling for diabetes management. Noted Dexamethazone 10 mg given this am.  Will likely restart home meds at discharge. Would encourage pt to f/u with endo for medication adjustments to achieve tighter control.  Thank you. Lorenda Peck, RD, LDN, CDE Inpatient Diabetes Coordinator [phone]

## 2013-09-21 NOTE — Progress Notes (Signed)
Inpatient Diabetes Program Recommendations  AACE/ADA: New Consensus Statement on Inpatient Glycemic Control (2013)  Target Ranges:  Prepandial:   less than 140 mg/dL      Peak postprandial:   less than 180 mg/dL (1-2 hours)      Critically ill patients:  140 - 180 mg/dL   Referral received for HgbA1C of 9.1%  Noted pt had been to Dr Loanne Drilling who had increased the meds treating his Diabetes. Ordered in-pt ed via bedside RN's through the System ed'l videos, Exit care notes, Instructional manual and dietician consult for education. Dr. Loanne Drilling also has an educator in the office.  Will try to talk with patient before d/c. Thank you, Rosita Kea, RN, CNS, Diabetes Coordinator [phone])

## 2013-09-22 LAB — GLUCOSE, CAPILLARY
GLUCOSE-CAPILLARY: 252 mg/dL — AB (ref 70–99)
Glucose-Capillary: 172 mg/dL — ABNORMAL HIGH (ref 70–99)
Glucose-Capillary: 345 mg/dL — ABNORMAL HIGH (ref 70–99)

## 2013-09-22 LAB — BASIC METABOLIC PANEL
BUN: 19 mg/dL (ref 6–23)
CHLORIDE: 99 meq/L (ref 96–112)
CO2: 22 meq/L (ref 19–32)
Calcium: 8.9 mg/dL (ref 8.4–10.5)
Creatinine, Ser: 0.84 mg/dL (ref 0.50–1.35)
GFR calc Af Amer: >90 mL/min (ref >90)
GFR calc non Af Amer: >90 mL/min (ref >90)
Glucose, Bld: 188 mg/dL — ABNORMAL HIGH (ref 70–99)
Potassium: 4.4 mEq/L (ref 3.7–5.3)
SODIUM: 138 meq/L (ref 137–147)

## 2013-09-22 LAB — CBC
HCT: 34.1 % — ABNORMAL LOW (ref 39.0–52.0)
HEMOGLOBIN: 11.7 g/dL — AB (ref 13.0–17.0)
MCH: 30.8 pg (ref 26.0–34.0)
MCHC: 34.3 g/dL (ref 30.0–36.0)
MCV: 89.7 fL (ref 78.0–100.0)
Platelets: 166 10*3/uL (ref 150–400)
RBC: 3.8 MIL/uL — AB (ref 4.22–5.81)
RDW: 12.7 % (ref 11.5–15.5)
WBC: 14.8 10*3/uL — ABNORMAL HIGH (ref 4.0–10.5)

## 2013-09-22 MED ORDER — BISACODYL 5 MG PO TBEC: DELAYED_RELEASE_TABLET | ORAL | Status: DC

## 2013-09-22 MED ORDER — ASPIRIN 325 MG PO TBEC: DELAYED_RELEASE_TABLET | ORAL | Status: AC

## 2013-09-22 MED ORDER — DSS 100 MG PO CAPS: ORAL_CAPSULE | ORAL | Status: DC

## 2013-09-22 MED ORDER — CELECOXIB 200 MG PO CAPS: ORAL_CAPSULE | ORAL | Status: AC

## 2013-09-22 MED ORDER — OXYCODONE HCL 5 MG PO TABS: ORAL_TABLET | ORAL | Status: DC

## 2013-09-22 MED ORDER — OXYCODONE HCL ER 20 MG PO T12A: 20.0000 mg | EXTENDED_RELEASE_TABLET | Freq: Two times a day (BID) | ORAL | Status: DC

## 2013-09-22 NOTE — Progress Notes (Signed)
Patient refused Chantix 0.5 mg PO last night.  He stated that he does not take this medication.

## 2013-09-22 NOTE — Progress Notes (Addendum)
PT Cancellation Note  Patient Details Name: [name] [name] MRN: [id number] DOB: Apr 28, 1956   Cancelled Treatment:    Reason Eval/Treat Not Completed: Patient declined, no reason specified Pt states he feels comfortable with all mobility at this time. He reports he is waiting to d/c soon. He has no further questions at this time.   [name], [name]  [name] 09/22/2013, 3:13 PM  Addendum for pager #

## 2013-09-22 NOTE — Progress Notes (Signed)
Physical Therapy Treatment Patient Details Name: [name] [name] MRN: [id number] DOB: November 29, 1955 Today's Date: 09/22/2013    History of Present Illness Patient is a 58 yo male s/p Rt TKA.    PT Comments    Pt is progressing very well with physical therapy goals, ambulating up to 150 feet with supervision. Pt has completed stair training and safely navigates steps with one rail similar to home environment. Pt will benefit from continued skilled PT services with HHPT upon d/c. Will continue to follow pt until d/c focusing on independence with functional mobility.   Follow Up Recommendations  Home health PT;Supervision for mobility/OOB     Equipment Recommendations  None recommended by PT    Recommendations for Other Services       Precautions / Restrictions Precautions Precautions: Knee Required Braces or Orthoses: Knee Immobilizer - Right Knee Immobilizer - Right: On when out of bed or walking Restrictions Weight Bearing Restrictions: Yes RLE Weight Bearing: Weight bearing as tolerated    Mobility  Bed Mobility                  Transfers Overall transfer level: Needs assistance Equipment used: Rolling walker (2 wheeled) Transfers: Sit to/from Stand Sit to Stand: Supervision         General transfer comment: Pt stands with supervision for safety, good control of knee and RW. No verbal cues needed for safe transfer to standing position  Ambulation/Gait Ambulation/Gait assistance: Supervision Ambulation Distance (Feet): 150 Feet Assistive device: Rolling walker (2 wheeled) Gait Pattern/deviations: Step-through pattern;Antalgic     General Gait Details: Pt needs verbal cues to decrease step length on right and to take his time when ambulating. Overall, ambulating well. Shows good control of RW after early cueing, and states he can fully bear weight through surgical knee.   Stairs Stairs: Yes Stairs assistance: Min guard Stair Management: One rail  Right;Step to pattern;Sideways Number of Stairs: 2 (x2) General stair comments: Pt safely navigates steps without physical assistance. Educated on how to safely climb/descend stairs and he has no further questions. One instance of knee buckling but able to self correct; pt instructed to take his time with the stairs and not rush.  Wheelchair Mobility    Modified Rankin (Stroke Patients Only)       Balance                                    Cognition Arousal/Alertness: Awake/alert Behavior During Therapy: WFL for tasks assessed/performed Overall Cognitive Status: Within Functional Limits for tasks assessed                      Exercises Total Joint Exercises Ankle Circles/Pumps: AROM;Both;10 reps;Seated Quad Sets: AROM;Right;5 reps;Seated    General Comments        Pertinent Vitals/Pain 2/10 pain Pt repositioned in chair for comfort    Home Living                      Prior Function            PT Goals (current goals can now be found in the care plan section) Acute Rehab PT Goals PT Goal Formulation: With patient Time For Goal Achievement: 09/28/13 Potential to Achieve Goals: Good Progress towards PT goals: Progressing toward goals    Frequency  7X/week    PT Plan Current plan remains appropriate  Co-evaluation             End of Session Equipment Utilized During Treatment: Gait belt Activity Tolerance: Patient tolerated treatment well Patient left: in chair;with call bell/phone within reach     Time: 1136-1157 PT Time Calculation (min): 21 min  Charges:  $Gait Training: 8-22 mins                    G Codes:      [employer], [place]  [name] 09/22/2013, 1:31 PM

## 2013-09-22 NOTE — Discharge Summary (Signed)
Patient ID: [name] [name] MRN: [id number] DOB/AGE: [birth year] 57 y.o.  Admit date: 09/21/2013 Discharge date: 09/22/2013  Admission Diagnoses:  Principal Problem:   Right knee DJD Active Problems:   DIABETES MELLITUS, TYPE II   HYPERLIPIDEMIA   HYPERTENSION   OSTEOARTHRITIS   DJD (degenerative joint disease) of knee   Discharge Diagnoses:  Same  Past Medical History  Diagnosis Date  . Diabetes   . Complication of anesthesia     problems using BR  . Hypertension   . Arthritis   . Cancer     basal cell removed from nose    Surgeries: Procedure(s): TOTAL KNEE ARTHROPLASTY HARDWARE REMOVAL on 09/21/2013   Consultants:    Discharged Condition: Improved  Hospital Course: [name] [name] is an 58 y.o. male who was admitted 09/21/2013 for operative treatment ofRight knee DJD. Patient has severe unremitting pain that affects sleep, daily activities, and work/hobbies. After pre-op clearance the patient was taken to the operating room on 09/21/2013 and underwent  Procedure(s): TOTAL KNEE ARTHROPLASTY HARDWARE REMOVAL.    Patient was given perioperative antibiotics: Anti-infectives   Start     Dose/Rate Route Frequency Ordered Stop   09/21/13 1245  ceFAZolin (ANCEF) IVPB 2 g/50 mL premix     2 g 100 mL/hr over 30 Minutes Intravenous Every 6 hours 09/21/13 1222 09/21/13 1906   09/21/13 0600  ceFAZolin (ANCEF) IVPB 2 g/50 mL premix     2 g 100 mL/hr over 30 Minutes Intravenous On call to O.R. 09/20/13 1428 09/21/13 0840       Patient was given sequential compression devices, early ambulation, and chemoprophylaxis to prevent DVT. Sugars were high 279 day of surgery  Used Lantus at night with insulin at meal time and a sliding scale.   Post op day one they were down below 200.  Patient benefited maximally from hospital stay and there were no other complications.    Recent vital signs: Patient Vitals for the past 24 hrs:  BP Temp Temp src Pulse Resp SpO2 Height Weight  09/22/13  0553 135/68 mmHg 98.1 F (36.7 C) Oral 71 16 96 % - -  09/21/13 2103 146/77 mmHg 98 F (36.7 C) Oral 83 16 96 % - -  09/21/13 1600 - - - - 16 97 % - -  09/21/13 1429 150/86 mmHg 99.5 F (37.5 C) Oral 88 16 97 % - -  09/21/13 1225 148/89 mmHg 97.9 F (36.6 C) Oral 85 16 94 % 6\' 3"  (1.905 m) 100.699 kg (222 lb)  09/21/13 1223 - - - - - 94 % - -  09/21/13 1200 153/93 mmHg 98 F (36.7 C) - 88 16 95 % - -  09/21/13 1145 151/78 mmHg - - 82 16 96 % - -  09/21/13 1130 - - - 80 13 98 % - -  09/21/13 1115 153/88 mmHg - - 79 14 97 % - -  09/21/13 1100 164/97 mmHg - - 86 15 97 % - -  09/21/13 1049 159/93 mmHg 97.9 F (36.6 C) - 75 14 98 % - -  09/21/13 0825 123/79 mmHg - - 66 14 98 % - -  09/21/13 0820 110/80 mmHg - - 72 17 90 % - -  09/21/13 0815 118/68 mmHg - - 59 17 90 % - -  09/21/13 0810 113/82 mmHg - - 62 9 100 % - -  09/21/13 0804 127/83 mmHg - - 64 - 96 % - -  09/21/13 0749 132/86 mmHg 98.5 F (  36.9 C) Oral 60 20 98 % - -  09/21/13 0747 - - - - - - - 101.095 kg (222 lb 14 oz)     Recent laboratory studies: No results found for this basename: WBC, HGB, HCT, PLT, NA, K, CL, CO2, BUN, CREATININE, GLUCOSE, PT, INR, CALCIUM, 2,  in the last 72 hours   Discharge Medications:     Medication List    STOP taking these medications       HYDROcodone-acetaminophen 5-325 MG per tablet  Commonly known as:  NORCO/VICODIN     varenicline 0.5 MG tablet  Commonly known as:  CHANTIX      TAKE these medications       aspirin 325 MG EC tablet  1 tab a day for the next 30 days to prevent blood clots     bisacodyl 5 MG EC tablet  Commonly known as:  DULCOLAX  Take 2 tablets every night with dinner until bowel movement.  LAXITIVE.  Restart if two days since last bowel movement     Canagliflozin 300 MG Tabs  Take 1 tablet by mouth daily.     celecoxib 200 MG capsule  Commonly known as:  CELEBREX  1 tab po q day with food for pain and  swelling     colesevelam 625 MG tablet  Commonly  known as:  WELCHOL  6 tabs daily     DSS 100 MG Caps  1 tab 2 times a day while on narcotics.  STOOL SOFTENER     glucose blood test strip  Commonly known as:  ONETOUCH VERIO  1 each by Other route daily. And lancets 1/day 250.02     metFORMIN 500 MG tablet  Commonly known as:  GLUCOPHAGE  Take 1,000 mg by mouth 2 (two) times daily. Take 2 tablets twice a day.     oxyCODONE 5 MG immediate release tablet  Commonly known as:  Oxy IR/ROXICODONE  1-2 tablets every 4-6 hrs as needed for pain     OxyCODONE 20 mg T12a 12 hr tablet  Commonly known as:  OXYCONTIN  Take 1 tablet (20 mg total) by mouth every 12 (twelve) hours.     sitaGLIPtin 100 MG tablet  Commonly known as:  JANUVIA  Take 100 mg by mouth daily.        Diagnostic Studies: Dg Chest 2 View  09/17/2013   CLINICAL DATA:  Preop for knee replacement surgery. History of hypertension.  EXAM: CHEST  2 VIEW  COMPARISON:  06/10/2007  FINDINGS: The heart size and mediastinal contours are within normal limits. Both lungs are clear. The visualized skeletal structures are unremarkable.  IMPRESSION: No active cardiopulmonary disease.   Electronically Signed   By: Shon Hale M.D.   On: 09/17/2013 14:35    Disposition:       Discharge Orders   Future Orders Complete By Expires   Call MD / Call 911  As directed    Comments:     If you experience chest pain or shortness of breath, CALL 911 and be transported to the hospital emergency room.  If you develope a fever above 101 F, pus (white drainage) or increased drainage or redness at the wound, or calf pain, call your surgeon's office.   Change dressing  As directed    Comments:     Change the dressing daily with sterile 4 x 4 inch gauze dressing and apply TED hose.  You may clean the incision with alcohol prior to redressing.  Constipation Prevention  As directed    Comments:     Drink plenty of fluids.  Prune juice may be helpful.  You may use a stool softener, such as Colace  (over the counter) 100 mg twice a day.  Use MiraLax (over the counter) for constipation as needed.   CPM  As directed    Comments:     Continuous passive motion machine (CPM):      Use the CPM from 0 to 90 for 6 hours per day.       You may break it up into 2 or 3 sessions per day.      Use CPM for 2 weeks or until you are told to stop.   Diet - low sodium heart healthy  As directed    Discharge instructions  As directed    Comments:     Total Knee Replacement Care After Refer to this sheet in the next few weeks. These discharge instructions provide you with general information on caring for yourself after you leave the hospital. Your caregiver may also give you specific instructions. Your treatment has been planned according to the most current medical practices available, but unavoidable complications sometimes occur. If you have any problems or questions after discharge, please call your caregiver. Regaining a near full range of motion of your knee within the first 3 to 6 weeks after surgery is critical. [place] may resume a normal diet and activities as directed.  Perform exercises as directed.  Place gray foam block, curve side up under heel at all times except when in CPM or when walking.  DO NOT modify, tear, cut, or change in any way the gray foam block. You will receive physical therapy daily  Take showers instead of baths until informed otherwise.  You may shower on Sunday.  Please wash whole leg including wound with soap and water  Change bandages (dressings)daily It is OK to take over-the-counter tylenol in addition to the oxycodone for pain, discomfort, or fever. Oxycodone is VERY constipating.  Please take stool softener twice a day and laxatives daily until bowels are regular Eat a well-balanced diet.  Avoid lifting or driving until you are instructed otherwise.  Make an appointment to see your caregiver for stitches (suture) or staple removal as directed.   If you have been sent home with a continuous passive motion machine (CPM machine), 0-90 degrees 6 hrs a day   2 hrs a shift SEEK MEDICAL CARE IF: You have swelling of your calf or leg.  You develop shortness of breath or chest pain.  You have redness, swelling, or increasing pain in the wound.  There is pus or any unusual drainage coming from the surgical site.  You notice a bad smell coming from the surgical site or dressing.  The surgical site breaks open after sutures or staples have been removed.  There is persistent bleeding from the suture or staple line.  You are getting worse or are not improving.  You have any other questions or concerns.  SEEK IMMEDIATE MEDICAL CARE IF:  You have a fever.  You develop a rash.  You have difficulty breathing.  You develop any reaction or side effects to medicines given.  Your knee motion is decreasing rather than improving.  MAKE SURE YOU:  Understand these instructions.  Will watch your condition.  Will get help right away if you are not doing well or get worse.   Do not put a pillow  under the knee. Place it under the heel.  As directed    Comments:     Place gray foam block, curve side up under heel at all times except when in CPM or when walking.  DO NOT modify, tear, cut, or change in any way the gray foam block.   Increase activity slowly as tolerated  As directed    TED hose  As directed    Comments:     Use stockings (TED hose) for 2 weeks on both leg(s).  You may remove them at night for sleeping.      Follow-up Information   Follow up with Lorn Junes, MD On 10/05/2013. (appt time 2:30 pm)    Specialty:  Orthopedic Surgery   Contact information:   [place] [address] [phone]        Signed: Linda Hedges 09/22/2013, 7:29 AM

## 2013-09-22 NOTE — Plan of Care (Signed)
Problem: Food- and Nutrition-Related Knowledge Deficit (NB-1.1) Goal: Nutrition education Formal process to instruct or train a patient/client in a skill or to impart knowledge to help patients/clients voluntarily manage or modify food choices and eating behavior to maintain or improve health. Outcome: Completed/Met Date Met:  09/22/13   RD consulted for nutrition education regarding diabetes.   Pt reports new dx of DM 5 years ago but never received very much info on diet. Pt has recently changed doctors and is taking new meds and is working on changing his diet for better blood glucose control. Focus on meal spacing, balance, and label reading.     Lab Results  Component Value Date    HGBA1C 9.1* 09/17/2013    RD provided "Carbohydrate Counting for People with Diabetes" handout from the Academy of Nutrition and Dietetics. Discussed different food groups and their effects on blood sugar, emphasizing carbohydrate-containing foods. Provided list of carbohydrates and recommended serving sizes of common foods.  Discussed importance of controlled and consistent carbohydrate intake throughout the day. Provided examples of ways to balance meals/snacks and encouraged intake of high-fiber, whole grain complex carbohydrates. Teach back method used.  Expect good compliance.  Body mass index is 27.75 kg/(m^2). Pt meets criteria for overweight based on current BMI.  Current diet order is CHO Modified, patient is consuming approximately 100% of meals at this time. Labs and medications reviewed. No further nutrition interventions warranted at this time. RD contact information provided. If additional nutrition issues arise, please re-consult RD.  [place], [place], [place] Pager [phone] After Hours Pager

## 2013-09-22 NOTE — Care Management Note (Signed)
CARE MANAGEMENT NOTE 09/22/2013  Patient:  [name] [name], [name] [name]   Account Number:  [id number]  Date Initiated:  09/21/2013  Documentation initiated by:  Ricki Miller  Subjective/Objective Assessment:   58 yr old male admitted with DJD right knee, s/p right total knee arthroplasty.     Action/Plan:   PT/OT eval  Case manager spoke with patient concerning home health and DME needs at discharge. Choice offered. Referral called to [facility], [place]. RW, 3in1 and CPM have been delivered. patient has assistance at discharge.   Anticipated DC Date:  09/22/2013   Anticipated DC Plan:  [place]  CM consult      PAC Choice  [place]   Choice offered to / List presented to:  C-1 Patient      DME agency  [employer]     [place] arranged  HH-2 PT      [place].   Status of service:  Completed, signed off Medicare Important Message given?   (If response is "NO", the following Medicare IM given date fields will be blank) Date Medicare IM given:   Date Additional Medicare IM given:    Discharge Disposition:  [place]  Per UR Regulation:

## 2013-09-23 ENCOUNTER — Encounter (HOSPITAL_COMMUNITY): Payer: Self-pay | Admitting: Orthopedic Surgery

## 2013-11-25 ENCOUNTER — Encounter: Payer: Self-pay | Admitting: Endocrinology

## 2013-11-25 ENCOUNTER — Ambulatory Visit (INDEPENDENT_AMBULATORY_CARE_PROVIDER_SITE_OTHER): Payer: Managed Care, Other (non HMO) | Admitting: Endocrinology

## 2013-11-25 VITALS — BP 120/66 | HR 75 | Temp 98.0°F | Ht 75.0 in | Wt 220.0 lb

## 2013-11-25 DIAGNOSIS — E119 Type 2 diabetes mellitus without complications: Secondary | ICD-10-CM

## 2013-11-25 NOTE — Progress Notes (Signed)
Subjective:    Patient ID: [name] [name], male    DOB: [birth year], 58 y.o.   MRN: [id number]  HPI pt returns for f/u of type 2 DM (dx'ed 2005; he has mild neuropathy of the lower extremities; he is unaware of any associated chronic complications; he has never been on insulin; he takes 4 oral meds; he does not take actos, due to edema).  no cbg record, but states cbg's are in the low-100's.  Past Medical History  Diagnosis Date  . Diabetes   . Complication of anesthesia     problems using BR  . Hypertension   . Arthritis   . Cancer     basal cell removed from nose    Past Surgical History  Procedure Laterality Date  . Shoulder arthroscopy with rotator cuff repair Right   . Knee arthroscopy w/ debridement Right   . Knee arthroscopy Right     3 surgeries to right knee  . Tonsillectomy    . Colonoscopy    . Total knee arthroplasty Right 09/21/2013    Procedure: TOTAL KNEE ARTHROPLASTY;  Surgeon: Lorn Junes, MD;  Location: [place];  Service: Orthopedics;  Laterality: Right;  . Hardware removal Right 09/21/2013    Procedure: HARDWARE REMOVAL;  Surgeon: Lorn Junes, MD;  Location: [facility];  Service: Orthopedics;  Laterality: Right;    History   Social History  . Marital Status: Married    Spouse Name: N/A    Number of Children: N/A  . Years of Education: N/A   Occupational History  . Not on file.   Social History Main Topics  . Smoking status: Former Research scientist (life sciences)  . Smokeless tobacco: Current User    Types: Snuff  . Alcohol Use: Yes     Comment: 6 drinks a week  . Drug Use: No  . Sexual Activity: Not on file   Other Topics Concern  . Not on file   Social History Narrative  . No narrative on file    Current Outpatient Prescriptions on File Prior to Visit  Medication Sig Dispense Refill  . aspirin EC 325 MG EC tablet 1 tab a day for the next 30 days to prevent blood clots  30 tablet  0  . bisacodyl (DULCOLAX) 5 MG EC tablet Take 2 tablets every night with dinner  until bowel movement.  LAXITIVE.  Restart if two days since last bowel movement  30 tablet  0  . Canagliflozin 300 MG TABS Take 1 tablet by mouth daily.      . celecoxib (CELEBREX) 200 MG capsule 1 tab po q day with food for pain and  swelling  30 capsule  3  . colesevelam (WELCHOL) 625 MG tablet 6 tabs daily  540 tablet  3  . docusate sodium 100 MG CAPS 1 tab 2 times a day while on narcotics.  STOOL SOFTENER  60 capsule  0  . glucose blood (ONETOUCH VERIO) test strip 1 each by Other route daily. And lancets 1/day 250.02  100 each  12  . metFORMIN (GLUCOPHAGE) 500 MG tablet Take 1,000 mg by mouth 2 (two) times daily. Take 2 tablets twice a day.      . sitaGLIPtin (JANUVIA) 100 MG tablet Take 100 mg by mouth daily.      Marland Kitchen oxyCODONE (OXY IR/ROXICODONE) 5 MG immediate release tablet 1-2 tablets every 4-6 hrs as needed for pain  100 tablet  0  . OxyCODONE (OXYCONTIN) 20 mg T12A 12 hr tablet  Take 1 tablet (20 mg total) by mouth every 12 (twelve) hours.  30 tablet  0   No current facility-administered medications on file prior to visit.    No Known Allergies  Family History  Problem Relation Age of Onset  . Breast cancer Mother   . Lymphoma Father   . Diabetes Father   . Heart disease Father 60    bypass surgery    BP 120/66  Pulse 75  Temp(Src) 98 F (36.7 C) (Oral)  Ht 6\' 3"  (1.905 m)  Wt 220 lb (99.791 kg)  BMI 27.50 kg/m2  SpO2 95%  Review of Systems He denies hypoglycemia. He has lost weight.      Objective:   Physical Exam Pulses: dorsalis pedis intact bilat.   Feet: no deformity. normal color and temp.  Trace bilat leg edema.   Skin:  no ulcer on the feet.   Neuro: sensation is intact to touch on the feet.   Lab Results  Component Value Date   HGBA1C 9.1* 09/17/2013      Assessment & Plan:  DM: control is apparently improved.   Edema: this limits oral rx options.  This impairs the ability to achieve glycemic control.  I'll work around this as best I can.   Weight  loss: this helps glycemic control.  i encouraged pt to continue.     Patient is advised the following: Patient Instructions  check your blood sugar once a day.  vary the time of day when you check, between before the 3 meals, and at bedtime.  also check if you have symptoms of your blood sugar being too high or too low.  please keep a record of the readings and bring it to your next appointment here.  You can write it on any piece of paper.  please call us sooner if your blood sugar goes below 70, or if you have a lot of readings over 200.   Please come back for a follow-up appointment in 4 months.  Please redo the blood test in 1 month.  If it is high, we'll add "bromocriptine."

## 2013-11-25 NOTE — Patient Instructions (Addendum)
check your blood sugar once a day.  vary the time of day when you check, between before the 3 meals, and at bedtime.  also check if you have symptoms of your blood sugar being too high or too low.  please keep a record of the readings and bring it to your next appointment here.  You can write it on any piece of paper.  please call us sooner if your blood sugar goes below 70, or if you have a lot of readings over 200.   Please come back for a follow-up appointment in 4 months.  Please redo the blood test in 1 month.  If it is high, we'll add "bromocriptine."

## 2013-12-17 ENCOUNTER — Other Ambulatory Visit: Payer: Self-pay | Admitting: Endocrinology

## 2013-12-23 ENCOUNTER — Other Ambulatory Visit (INDEPENDENT_AMBULATORY_CARE_PROVIDER_SITE_OTHER): Payer: Managed Care, Other (non HMO)

## 2013-12-23 ENCOUNTER — Other Ambulatory Visit: Payer: Self-pay | Admitting: Endocrinology

## 2013-12-23 DIAGNOSIS — E119 Type 2 diabetes mellitus without complications: Secondary | ICD-10-CM

## 2013-12-23 LAB — HEMOGLOBIN A1C: HEMOGLOBIN A1C: 8.3 % — AB (ref 4.6–6.5)

## 2013-12-23 MED ORDER — BROMOCRIPTINE MESYLATE 2.5 MG PO TABS: 1.2500 mg | ORAL_TABLET | Freq: Every day | ORAL | Status: DC

## 2014-02-12 ENCOUNTER — Telehealth: Payer: Self-pay

## 2014-02-12 NOTE — Telephone Encounter (Signed)
Diabetic Bundle. Lvom for pt to call back and schedule appointment with Dr. Ellison.  

## 2014-02-15 ENCOUNTER — Encounter: Payer: Self-pay | Admitting: Gastroenterology

## 2014-03-31 ENCOUNTER — Other Ambulatory Visit: Payer: Self-pay | Admitting: Endocrinology

## 2014-04-16 ENCOUNTER — Other Ambulatory Visit: Payer: Self-pay

## 2014-04-16 MED ORDER — SITAGLIPTIN PHOSPHATE 100 MG PO TABS: 100.0000 mg | ORAL_TABLET | Freq: Every day | ORAL | Status: DC

## 2014-04-16 MED ORDER — COLESEVELAM HCL 625 MG PO TABS: ORAL_TABLET | ORAL | Status: DC

## 2014-04-16 MED ORDER — CANAGLIFLOZIN 300 MG PO TABS: 300.0000 mg | ORAL_TABLET | Freq: Every day | ORAL | Status: DC

## 2014-04-16 MED ORDER — METFORMIN HCL 500 MG PO TABS: ORAL_TABLET | ORAL | Status: DC

## 2014-11-16 ENCOUNTER — Telehealth: Payer: Self-pay

## 2014-11-16 NOTE — Telephone Encounter (Signed)
Left voicemail advising patient his Follow up office visit with Dr. Loanne Drilling is due. Patient is due for a serum creatinine test. I requested a call back from the patient for his to scheduled his office visit.

## 2015-02-14 ENCOUNTER — Encounter: Payer: Self-pay | Admitting: Endocrinology

## 2015-02-14 ENCOUNTER — Ambulatory Visit (INDEPENDENT_AMBULATORY_CARE_PROVIDER_SITE_OTHER): Payer: Managed Care, Other (non HMO) | Admitting: Endocrinology

## 2015-02-14 VITALS — BP 142/97 | HR 92 | Temp 98.2°F | Ht 75.0 in | Wt 226.0 lb

## 2015-02-14 DIAGNOSIS — R5382 Chronic fatigue, unspecified: Secondary | ICD-10-CM

## 2015-02-14 DIAGNOSIS — C44311 Basal cell carcinoma of skin of nose: Secondary | ICD-10-CM | POA: Diagnosis not present

## 2015-02-14 DIAGNOSIS — E1142 Type 2 diabetes mellitus with diabetic polyneuropathy: Secondary | ICD-10-CM | POA: Diagnosis not present

## 2015-02-14 DIAGNOSIS — R5383 Other fatigue: Secondary | ICD-10-CM | POA: Insufficient documentation

## 2015-02-14 LAB — POCT GLYCOSYLATED HEMOGLOBIN (HGB A1C): HEMOGLOBIN A1C: 9.7

## 2015-02-14 MED ORDER — BROMOCRIPTINE MESYLATE 2.5 MG PO TABS: 1.2500 mg | ORAL_TABLET | Freq: Every day | ORAL | Status: AC

## 2015-02-14 MED ORDER — GLIMEPIRIDE 4 MG PO TABS
4.0000 mg | ORAL_TABLET | Freq: Every day | ORAL | Status: DC
Start: 2015-02-14 — End: 2015-04-18

## 2015-02-14 NOTE — Patient Instructions (Addendum)
check your blood sugar once a day.  vary the time of day when you check, between before the 3 meals, and at bedtime.  also check if you have symptoms of your blood sugar being too high or too low.  please keep a record of the readings and bring it to your next appointment here.  You can write it on any piece of paper.  please call us sooner if your blood sugar goes below 70, or if you have a lot of readings over 200.   Please come back for a follow-up appointment in 2 months.   A blood test is requested for you today.  We'll let you know about the result. Please see Vaughan Basta, to learn about insulin the same day.  i have sent a prescription to your pharmacy, for 2 more diabetes pills.    Diabetes Mellitus and Food It is important for you to manage your blood sugar (glucose) level. Your blood glucose level can be greatly affected by what you eat. Eating healthier foods in the appropriate amounts throughout the day at about the same time each day will help you control your blood glucose level. It can also help slow or prevent worsening of your diabetes mellitus. Healthy eating may even help you improve the level of your blood pressure and reach or maintain a healthy weight.  HOW CAN FOOD AFFECT ME? Carbohydrates Carbohydrates affect your blood glucose level more than any other type of food. Your dietitian will help you determine how many carbohydrates to eat at each meal and teach you how to count carbohydrates. Counting carbohydrates is important to keep your blood glucose at a healthy level, especially if you are using insulin or taking certain medicines for diabetes mellitus. Alcohol Alcohol can cause sudden decreases in blood glucose (hypoglycemia), especially if you use insulin or take certain medicines for diabetes mellitus. Hypoglycemia can be a life-threatening condition. Symptoms of hypoglycemia (sleepiness, dizziness, and disorientation) are similar to symptoms of having too much alcohol.  If your  health care provider has given you approval to drink alcohol, do so in moderation and use the following guidelines:  Women should not have more than one drink per day, and men should not have more than two drinks per day. One drink is equal to:  12 oz of beer.  5 oz of wine.  1 oz of hard liquor.  Do not drink on an empty stomach.  Keep yourself hydrated. Have water, diet soda, or unsweetened iced tea.  Regular soda, juice, and other mixers might contain a lot of carbohydrates and should be counted. WHAT FOODS ARE NOT RECOMMENDED? As you make food choices, it is important to remember that all foods are not the same. Some foods have fewer nutrients per serving than other foods, even though they might have the same number of calories or carbohydrates. It is difficult to get your body what it needs when you eat foods with fewer nutrients. Examples of foods that you should avoid that are high in calories and carbohydrates but low in nutrients include:  Trans fats (most processed foods list trans fats on the Nutrition Facts label).  Regular soda.  Juice.  Candy.  Sweets, such as cake, pie, doughnuts, and cookies.  Fried foods. WHAT FOODS CAN I EAT? Have nutrient-rich foods, which will nourish your body and keep you healthy. The food you should eat also will depend on several factors, including:  The calories you need.  The medicines you take.  Your weight.  Your blood glucose level.  Your blood pressure level.  Your cholesterol level. You also should eat a variety of foods, including:  Protein, such as meat, poultry, fish, tofu, nuts, and seeds (lean animal proteins are best).  Fruits.  Vegetables.  Dairy products, such as milk, cheese, and yogurt (low fat is best).  Breads, grains, pasta, cereal, rice, and beans.  Fats such as olive oil, trans fat-free margarine, canola oil, avocado, and olives. DOES EVERYONE WITH DIABETES MELLITUS HAVE THE SAME MEAL  PLAN? Because every person with diabetes mellitus is different, there is not one meal plan that works for everyone. It is very important that you meet with a dietitian who will help you create a meal plan that is just right for you. Document Released: 03/01/2005 Document Revised: 06/09/2013 Document Reviewed: 05/01/2013 [facility] Patient Information 2015 [place], [place]. This information is not intended to replace advice given to you by your health care provider. Make sure you discuss any questions you have with your health care provider.

## 2015-02-14 NOTE — Progress Notes (Signed)
Subjective:    Patient ID: [name] [name], male    DOB: 04/23/1956, 59 y.o.   MRN: [id number]  HPI Pt returns for f/u of diabetes mellitus: DM type: 2 Dx'ed: 3976 Complications: polyneuropathy Therapy: 4 oral meds DKA: never Severe hypoglycemia: never Pancreatitis: never Other: he has never been on insulin; edema limits oral options Interval history: He did not take parlodel.  no cbg record, but states cbg's are in the mid-100's.  pt states he feels well in general, except for anxiety.   Pt requests f/u of basal cell cancer of the skin.   Past Medical History  Diagnosis Date  . Diabetes   . Complication of anesthesia     problems using BR  . Hypertension   . Arthritis   . Cancer     basal cell removed from nose    Past Surgical History  Procedure Laterality Date  . Shoulder arthroscopy with rotator cuff repair Right   . Knee arthroscopy w/ debridement Right   . Knee arthroscopy Right     3 surgeries to right knee  . Tonsillectomy    . Colonoscopy    . Total knee arthroplasty Right 09/21/2013    Procedure: TOTAL KNEE ARTHROPLASTY;  Surgeon: Lorn Junes, MD;  Location: [facility];  Service: Orthopedics;  Laterality: Right;  . Hardware removal Right 09/21/2013    Procedure: HARDWARE REMOVAL;  Surgeon: Lorn Junes, MD;  Location: [facility];  Service: Orthopedics;  Laterality: Right;    Social History   Social History  . Marital Status: Married    Spouse Name: N/A  . Number of Children: N/A  . Years of Education: N/A   Occupational History  . Not on file.   Social History Main Topics  . Smoking status: Former Research scientist (life sciences)  . Smokeless tobacco: Current User    Types: Snuff  . Alcohol Use: Yes     Comment: 6 drinks a week  . Drug Use: No  . Sexual Activity: Not on file   Other Topics Concern  . Not on file   Social History Narrative    Current Outpatient Prescriptions on File Prior to Visit  Medication Sig Dispense Refill  . aspirin EC 325 MG EC tablet 1 tab a  day for the next 30 days to prevent blood clots 30 tablet 0  . bisacodyl (DULCOLAX) 5 MG EC tablet Take 2 tablets every night with dinner until bowel movement.  LAXITIVE.  Restart if two days since last bowel movement 30 tablet 0  . canagliflozin (INVOKANA) 300 MG TABS tablet Take 300 mg by mouth daily. 90 tablet 0  . celecoxib (CELEBREX) 200 MG capsule 1 tab po q day with food for pain and  swelling 30 capsule 3  . docusate sodium 100 MG CAPS 1 tab 2 times a day while on narcotics.  STOOL SOFTENER 60 capsule 0  . glucose blood (ONETOUCH VERIO) test strip 1 each by Other route daily. And lancets 1/day 250.02 100 each 12  . metFORMIN (GLUCOPHAGE) 500 MG tablet TAKE 2 TABLETS BY MOUTH TWICE A DAY 360 tablet 0  . oxyCODONE (OXY IR/ROXICODONE) 5 MG immediate release tablet 1-2 tablets every 4-6 hrs as needed for pain 100 tablet 0  . OxyCODONE (OXYCONTIN) 20 mg T12A 12 hr tablet Take 1 tablet (20 mg total) by mouth every 12 (twelve) hours. 30 tablet 0  . sitaGLIPtin (JANUVIA) 100 MG tablet Take 1 tablet (100 mg total) by mouth daily. 90 tablet 1  .  colesevelam (WELCHOL) 625 MG tablet 6 tabs daily (Patient not taking: Reported on 02/14/2015) 540 tablet 0   No current facility-administered medications on file prior to visit.    No Known Allergies  Family History  Problem Relation Age of Onset  . Breast cancer Mother   . Lymphoma Father   . Diabetes Father   . Heart disease Father 60    bypass surgery    BP 142/97 mmHg  Pulse 92  Temp(Src) 98.2 F (36.8 C) (Oral)  Ht 6\' 3"  (1.905 m)  Wt 226 lb (102.513 kg)  BMI 28.25 kg/m2  SpO2 97%   Review of Systems He denies hypoglycemia.  He has fatigue (wants to have testosterone checked).      Objective:   Physical Exam VITAL SIGNS:  See vs page GENERAL: no distress Pulses: dorsalis pedis intact bilat.   MSK: no deformity of the feet CV: no leg edema Skin:  no ulcer on the feet.  normal color and temp on the feet.  Healed BCCA site on the  nose Neuro: sensation is intact to touch on the feet.    A1c=9.7%    Assessment & Plan:  DM: worse.  He may need insulin, but he declines for now.   Fatigue: new, uncertain etiology H/o BCCA of the skin, f/u requested    Patient is advised the following: Patient Instructions  check your blood sugar once a day.  vary the time of day when you check, between before the 3 meals, and at bedtime.  also check if you have symptoms of your blood sugar being too high or too low.  please keep a record of the readings and bring it to your next appointment here.  You can write it on any piece of paper.  please call us sooner if your blood sugar goes below 70, or if you have a lot of readings over 200.   Please come back for a follow-up appointment in 2 months.   A blood test is requested for you today.  We'll let you know about the result. Please see Vaughan Basta, to learn about insulin the same day.  i have sent a prescription to your pharmacy, for 2 more diabetes pills.    Diabetes Mellitus and Food It is important for you to manage your blood sugar (glucose) level. Your blood glucose level can be greatly affected by what you eat. Eating healthier foods in the appropriate amounts throughout the day at about the same time each day will help you control your blood glucose level. It can also help slow or prevent worsening of your diabetes mellitus. Healthy eating may even help you improve the level of your blood pressure and reach or maintain a healthy weight.  HOW CAN FOOD AFFECT ME? Carbohydrates Carbohydrates affect your blood glucose level more than any other type of food. Your dietitian will help you determine how many carbohydrates to eat at each meal and teach you how to count carbohydrates. Counting carbohydrates is important to keep your blood glucose at a healthy level, especially if you are using insulin or taking certain medicines for diabetes mellitus. Alcohol Alcohol can cause sudden decreases in  blood glucose (hypoglycemia), especially if you use insulin or take certain medicines for diabetes mellitus. Hypoglycemia can be a life-threatening condition. Symptoms of hypoglycemia (sleepiness, dizziness, and disorientation) are similar to symptoms of having too much alcohol.  If your health care provider has given you approval to drink alcohol, do so in moderation and use the  following guidelines:  Women should not have more than one drink per day, and men should not have more than two drinks per day. One drink is equal to:  12 oz of beer.  5 oz of wine.  1 oz of hard liquor.  Do not drink on an empty stomach.  Keep yourself hydrated. Have water, diet soda, or unsweetened iced tea.  Regular soda, juice, and other mixers might contain a lot of carbohydrates and should be counted. WHAT FOODS ARE NOT RECOMMENDED? As you make food choices, it is important to remember that all foods are not the same. Some foods have fewer nutrients per serving than other foods, even though they might have the same number of calories or carbohydrates. It is difficult to get your body what it needs when you eat foods with fewer nutrients. Examples of foods that you should avoid that are high in calories and carbohydrates but low in nutrients include:  Trans fats (most processed foods list trans fats on the Nutrition Facts label).  Regular soda.  Juice.  Candy.  Sweets, such as cake, pie, doughnuts, and cookies.  Fried foods. WHAT FOODS CAN I EAT? Have nutrient-rich foods, which will nourish your body and keep you healthy. The food you should eat also will depend on several factors, including:  The calories you need.  The medicines you take.  Your weight.  Your blood glucose level.  Your blood pressure level.  Your cholesterol level. You also should eat a variety of foods, including:  Protein, such as meat, poultry, fish, tofu, nuts, and seeds (lean animal proteins are  best).  Fruits.  Vegetables.  Dairy products, such as milk, cheese, and yogurt (low fat is best).  Breads, grains, pasta, cereal, rice, and beans.  Fats such as olive oil, trans fat-free margarine, canola oil, avocado, and olives. DOES EVERYONE WITH DIABETES MELLITUS HAVE THE SAME MEAL PLAN? Because every person with diabetes mellitus is different, there is not one meal plan that works for everyone. It is very important that you meet with a dietitian who will help you create a meal plan that is just right for you. Document Released: 03/01/2005 Document Revised: 06/09/2013 Document Reviewed: 05/01/2013 [facility] Patient Information 2015 [place], [place]. This information is not intended to replace advice given to you by your health care provider. Make sure you discuss any questions you have with your health care provider.

## 2015-02-16 LAB — TESTOSTERONE,FREE AND TOTAL
TESTOSTERONE: 371 ng/dL (ref 348–1197)
Testosterone, Free: 6.4 pg/mL — ABNORMAL LOW (ref 7.2–24.0)

## 2015-03-28 ENCOUNTER — Telehealth: Payer: Self-pay | Admitting: Nutrition

## 2015-03-28 NOTE — Telephone Encounter (Signed)
Message left on machine to call me to change the appt. He has with me on 11/26 at 3:15 to either the following Tuesday, or Wednesday at 3:15.

## 2015-04-13 ENCOUNTER — Ambulatory Visit: Payer: Managed Care, Other (non HMO) | Admitting: Nutrition

## 2015-04-18 ENCOUNTER — Encounter: Payer: Self-pay | Admitting: Endocrinology

## 2015-04-18 ENCOUNTER — Ambulatory Visit (INDEPENDENT_AMBULATORY_CARE_PROVIDER_SITE_OTHER): Payer: Managed Care, Other (non HMO) | Admitting: Endocrinology

## 2015-04-18 VITALS — BP 160/94 | HR 76 | Temp 98.2°F | Resp 20 | Wt 227.2 lb

## 2015-04-18 DIAGNOSIS — E1142 Type 2 diabetes mellitus with diabetic polyneuropathy: Secondary | ICD-10-CM | POA: Diagnosis not present

## 2015-04-18 DIAGNOSIS — E119 Type 2 diabetes mellitus without complications: Secondary | ICD-10-CM

## 2015-04-18 LAB — POCT GLYCOSYLATED HEMOGLOBIN (HGB A1C): Hemoglobin A1C: 6.6

## 2015-04-18 MED ORDER — GLIMEPIRIDE 2 MG PO TABS: 2.0000 mg | ORAL_TABLET | Freq: Every day | ORAL | Status: AC

## 2015-04-18 NOTE — Progress Notes (Signed)
Subjective:    Patient ID: [name] [name], male    DOB: 10-Aug-1955, 59 y.o.   MRN: [id number]  HPI Pt returns for f/u of diabetes mellitus: DM type: 2 Dx'ed: 0263 Complications: polyneuropathy Therapy: 6 oral meds.  DKA: never Severe hypoglycemia: never. Pancreatitis: never Other: he has never been on insulin; edema limits oral options.   Interval history: He lost his cbg meter.  pt states he feels well in general, except for anxiety.   Past Medical History  Diagnosis Date  . Diabetes ([place])   . Complication of anesthesia     problems using BR  . Hypertension   . Arthritis   . Cancer ([place])     basal cell removed from nose    Past Surgical History  Procedure Laterality Date  . Shoulder arthroscopy with rotator cuff repair Right   . Knee arthroscopy w/ debridement Right   . Knee arthroscopy Right     3 surgeries to right knee  . Tonsillectomy    . Colonoscopy    . Total knee arthroplasty Right 09/21/2013    Procedure: TOTAL KNEE ARTHROPLASTY;  Surgeon: Lorn Junes, MD;  Location: [facility];  Service: Orthopedics;  Laterality: Right;  . Hardware removal Right 09/21/2013    Procedure: HARDWARE REMOVAL;  Surgeon: Lorn Junes, MD;  Location: [facility];  Service: Orthopedics;  Laterality: Right;    Social History   Social History  . Marital Status: Married    Spouse Name: N/A  . Number of Children: N/A  . Years of Education: N/A   Occupational History  . Not on file.   Social History Main Topics  . Smoking status: Former Research scientist (life sciences)  . Smokeless tobacco: Current User    Types: Snuff  . Alcohol Use: Yes     Comment: 6 drinks a week  . Drug Use: No  . Sexual Activity: Not on file   Other Topics Concern  . Not on file   Social History Narrative    Current Outpatient Prescriptions on File Prior to Visit  Medication Sig Dispense Refill  . aspirin EC 325 MG EC tablet 1 tab a day for the next 30 days to prevent blood clots 30 tablet 0  . bisacodyl (DULCOLAX) 5 MG EC  tablet Take 2 tablets every night with dinner until bowel movement.  LAXITIVE.  Restart if two days since last bowel movement 30 tablet 0  . bromocriptine (PARLODEL) 2.5 MG tablet Take 0.5 tablets (1.25 mg total) by mouth at bedtime. 45 tablet 3  . canagliflozin (INVOKANA) 300 MG TABS tablet Take 300 mg by mouth daily. 90 tablet 0  . celecoxib (CELEBREX) 200 MG capsule 1 tab po q day with food for pain and  swelling 30 capsule 3  . colesevelam (WELCHOL) 625 MG tablet 6 tabs daily 540 tablet 0  . docusate sodium 100 MG CAPS 1 tab 2 times a day while on narcotics.  STOOL SOFTENER 60 capsule 0  . glucose blood (ONETOUCH VERIO) test strip 1 each by Other route daily. And lancets 1/day 250.02 100 each 12  . metFORMIN (GLUCOPHAGE) 500 MG tablet TAKE 2 TABLETS BY MOUTH TWICE A DAY 360 tablet 0  . oxyCODONE (OXY IR/ROXICODONE) 5 MG immediate release tablet 1-2 tablets every 4-6 hrs as needed for pain 100 tablet 0  . OxyCODONE (OXYCONTIN) 20 mg T12A 12 hr tablet Take 1 tablet (20 mg total) by mouth every 12 (twelve) hours. 30 tablet 0  . sitaGLIPtin (JANUVIA) 100  MG tablet Take 1 tablet (100 mg total) by mouth daily. 90 tablet 1   No current facility-administered medications on file prior to visit.    No Known Allergies  Family History  Problem Relation Age of Onset  . Breast cancer Mother   . Lymphoma Father   . Diabetes Father   . Heart disease Father 60    bypass surgery    BP 160/94 mmHg  Pulse 76  Temp(Src) 98.2 F (36.8 C) (Oral)  Resp 20  Wt 227 lb 3.2 oz (103.057 kg)  SpO2 96%  Review of Systems He denies hypoglycemia.      Objective:   Physical Exam VITAL SIGNS:  See vs page GENERAL: no distress Pulses: dorsalis pedis intact bilat.   MSK: no deformity of the feet CV: no leg edema Skin:  no ulcer on the feet.  normal color and temp on the feet. Neuro: sensation is intact to touch on the feet Ext: There is bilateral onychomycosis of the toenails  A1c=6.6%    Assessment  & Plan:  DM: overcontrolled, given this sulfonylurea-containing regimen.  Patient is advised the following: Patient Instructions  check your blood sugar once a day.  vary the time of day when you check, between before the 3 meals, and at bedtime.  also check if you have symptoms of your blood sugar being too high or too low.  please keep a record of the readings and bring it to your next appointment here.  You can write it on any piece of paper.  please call us sooner if your blood sugar goes below 70, or if you have a lot of readings over 200.   Please reduce the glimepiride to 2 mg each morning.   Here are 2 new meters (1 each for home and work).   Please see Dr Ronnald Ramp soon for a regular physical.   Please come back for a follow-up appointment in 3 months.

## 2015-04-18 NOTE — Patient Instructions (Addendum)
check your blood sugar once a day.  vary the time of day when you check, between before the 3 meals, and at bedtime.  also check if you have symptoms of your blood sugar being too high or too low.  please keep a record of the readings and bring it to your next appointment here.  You can write it on any piece of paper.  please call us sooner if your blood sugar goes below 70, or if you have a lot of readings over 200.   Please reduce the glimepiride to 2 mg each morning.   Here are 2 new meters (1 each for home and work).   Please see Dr Ronnald Ramp soon for a regular physical.   Please come back for a follow-up appointment in 3 months.

## 2015-04-26 ENCOUNTER — Encounter: Payer: Managed Care, Other (non HMO) | Attending: Endocrinology | Admitting: Nutrition

## 2015-04-26 VITALS — Wt 228.8 lb

## 2015-04-26 DIAGNOSIS — Z713 Dietary counseling and surveillance: Secondary | ICD-10-CM | POA: Insufficient documentation

## 2015-04-26 DIAGNOSIS — E1142 Type 2 diabetes mellitus with diabetic polyneuropathy: Secondary | ICD-10-CM | POA: Diagnosis not present

## 2015-04-27 NOTE — Progress Notes (Signed)
Discussed the idea of insulin resistance with him and ways he can help his body's insulin to work  Better.  Stressed need for 30 min. of exercise, and weight loss to help with this.   Discussed the idea of balanced meals and making lower fat options.  Most of his meals are balanced, but high in fat.   Typical day: 5AM: up 5:30 eating out:  2 eggs, sausage, chees, hash browns and biscuit, or  Bagel with cream cheese, black coffee 9AM Yoplait yogurt: 150 calories 12Noon:  Lunch:  Soup with sandwich, pimento cheese or ham and cheese with mayo,  Chips, water to drink 4-5PM; supper: meat: 8-12 ounce steak, or 2 pieces of chicken one starchy veg. And 2 nonstarchy veg., salad with "lots of ranch dressing".    Bread is rare.  Water to drink No HS snacking  He is requesting a 3 day menu, and would like to loose 3-5 pounds.    Activity:  Very active a work most days.  "Always moving,climbing, lifting etc"No other activity after work.  Weekends are rest days. Plan: Reduce the amount of fat in diet by switching to [place] or other low fat, low sodium meats.   Stop the cream cheese and switch to low fat peanut butter or low fat cheese. Reduce meat portion at supper to no more than 6- 8 ounces Reduce amount of dressing on salads by 1/2, or switch to light dressing Walk for 10 min. At lunch and 20 min.after supper.

## 2015-04-27 NOTE — Progress Notes (Deleted)
Subjective:     Patient ID: [name] [name], male   DOB: January 11, 1956, 59 y.o.   MRN: [id number]  HPI   Review of Systems     Objective:   Physical Exam     Assessment:     ***    Plan:     ***

## 2015-05-10 NOTE — Patient Instructions (Signed)
Reduce the amount of fat in diet by switching to [place] or other low fat, low sodium meats.   Stop the cream cheese and switch to low fat peanut butter or low fat cheese. Reduce meat portion at supper to no more than 6- 8 ounces Reduce amount of dressing on salads by 1/2, or switch to light dressing Walk for 10 min. At lunch and 20 min.after supper.

## 2015-05-31 ENCOUNTER — Encounter: Payer: Self-pay | Admitting: Endocrinology

## 2015-06-21 ENCOUNTER — Other Ambulatory Visit: Payer: Self-pay | Admitting: Endocrinology

## 2015-06-21 ENCOUNTER — Ambulatory Visit: Payer: Managed Care, Other (non HMO) | Admitting: Endocrinology

## 2015-06-22 ENCOUNTER — Encounter: Payer: Managed Care, Other (non HMO) | Admitting: Nutrition

## 2018-02-04 ENCOUNTER — Encounter (HOSPITAL_COMMUNITY): Payer: Self-pay

## 2018-02-04 ENCOUNTER — Emergency Department (HOSPITAL_COMMUNITY)
Admission: EM | Admit: 2018-02-04 | Discharge: 2018-02-04 | Disposition: A | Discharge status: Home / Self Care | Payer: Managed Care, Other (non HMO) | Attending: Emergency Medicine | Admitting: Emergency Medicine

## 2018-02-04 ENCOUNTER — Other Ambulatory Visit: Payer: Self-pay

## 2018-02-04 DIAGNOSIS — Z87891 Personal history of nicotine dependence: Secondary | ICD-10-CM | POA: Diagnosis not present

## 2018-02-04 DIAGNOSIS — L509 Urticaria, unspecified: Secondary | ICD-10-CM | POA: Diagnosis present

## 2018-02-04 DIAGNOSIS — Z96651 Presence of right artificial knee joint: Secondary | ICD-10-CM | POA: Insufficient documentation

## 2018-02-04 DIAGNOSIS — T782XXA Anaphylactic shock, unspecified, initial encounter: Secondary | ICD-10-CM | POA: Diagnosis not present

## 2018-02-04 DIAGNOSIS — E1165 Type 2 diabetes mellitus with hyperglycemia: Secondary | ICD-10-CM | POA: Diagnosis not present

## 2018-02-04 DIAGNOSIS — R739 Hyperglycemia, unspecified: Secondary | ICD-10-CM

## 2018-02-04 DIAGNOSIS — I10 Essential (primary) hypertension: Secondary | ICD-10-CM | POA: Diagnosis not present

## 2018-02-04 LAB — COMPREHENSIVE METABOLIC PANEL
ALBUMIN: 4.1 g/dL (ref 3.5–5.0)
ALK PHOS: 56 U/L (ref 38–126)
ALT: 38 U/L (ref 0–44)
ANION GAP: 16 — AB (ref 5–15)
AST: 28 U/L (ref 15–41)
BILIRUBIN TOTAL: 1.1 mg/dL (ref 0.3–1.2)
BUN: 17 mg/dL (ref 8–23)
CALCIUM: 9.5 mg/dL (ref 8.9–10.3)
CO2: 23 mmol/L (ref 22–32)
Chloride: 97 mmol/L — ABNORMAL LOW (ref 98–111)
Creatinine, Ser: 1.34 mg/dL — ABNORMAL HIGH (ref 0.61–1.24)
GFR calc non Af Amer: 56 mL/min — ABNORMAL LOW (ref >60)
GLUCOSE: 391 mg/dL — AB (ref 70–99)
Potassium: 3.6 mmol/L (ref 3.5–5.1)
Sodium: 136 mmol/L (ref 135–145)
TOTAL PROTEIN: 6.6 g/dL (ref 6.5–8.1)

## 2018-02-04 LAB — CBC WITH DIFFERENTIAL/PLATELET
Abs Immature Granulocytes: 0 10*3/uL (ref 0.0–0.1)
BASOS ABS: 0 10*3/uL (ref 0.0–0.1)
Basophils Relative: 0 %
EOS PCT: 1 %
Eosinophils Absolute: 0.1 10*3/uL (ref 0.0–0.7)
HEMATOCRIT: 44.6 % (ref 39.0–52.0)
Hemoglobin: 15.2 g/dL (ref 13.0–17.0)
Immature Granulocytes: 0 %
LYMPHS ABS: 1.6 10*3/uL (ref 0.7–4.0)
Lymphocytes Relative: 14 %
MCH: 32.1 pg (ref 26.0–34.0)
MCHC: 34.1 g/dL (ref 30.0–36.0)
MCV: 94.1 fL (ref 78.0–100.0)
MONO ABS: 0.6 10*3/uL (ref 0.1–1.0)
Monocytes Relative: 5 %
Neutro Abs: 8.9 10*3/uL — ABNORMAL HIGH (ref 1.7–7.7)
Neutrophils Relative %: 80 %
Platelets: 190 10*3/uL (ref 150–400)
RBC: 4.74 MIL/uL (ref 4.22–5.81)
RDW: 11.6 % (ref 11.5–15.5)
WBC: 11.2 10*3/uL — AB (ref 4.0–10.5)

## 2018-02-04 LAB — CBG MONITORING, ED
GLUCOSE-CAPILLARY: 376 mg/dL — AB (ref 70–99)
GLUCOSE-CAPILLARY: 358 mg/dL — AB (ref 70–99)
GLUCOSE-CAPILLARY: 282 mg/dL — AB (ref 70–99)

## 2018-02-04 MED ORDER — METHYLPREDNISOLONE SODIUM SUCC 125 MG IJ SOLR
125.0000 mg | Freq: Once | INTRAMUSCULAR | Status: AC
  Administered 2018-02-04: 125 mg via INTRAVENOUS
  Filled 2018-02-04: qty 2

## 2018-02-04 MED ORDER — PREDNISONE 20 MG PO TABS: 40.0000 mg | ORAL_TABLET | Freq: Every day | ORAL | 0 refills | Status: AC

## 2018-02-04 MED ORDER — EPINEPHRINE 0.3 MG/0.3ML IJ SOAJ: 0.3000 mg | Freq: Once | INTRAMUSCULAR | 1 refills | Status: AC | PRN

## 2018-02-04 MED ORDER — METFORMIN HCL 500 MG PO TABS: 500.0000 mg | ORAL_TABLET | Freq: Two times a day (BID) | ORAL | 2 refills | Status: AC

## 2018-02-04 MED ORDER — INSULIN ASPART 100 UNIT/ML ~~LOC~~ SOLN
10.0000 [IU] | Freq: Once | SUBCUTANEOUS | Status: AC
  Administered 2018-02-04: 10 [IU] via SUBCUTANEOUS
  Filled 2018-02-04: qty 1

## 2018-02-04 MED ORDER — SODIUM CHLORIDE 0.9 % IV BOLUS
1000.0000 mL | Freq: Once | INTRAVENOUS | Status: AC
  Administered 2018-02-04: 1000 mL via INTRAVENOUS

## 2018-02-04 MED ORDER — DIPHENHYDRAMINE HCL 50 MG/ML IJ SOLN
50.0000 mg | Freq: Once | INTRAMUSCULAR | Status: AC
  Administered 2018-02-04: 50 mg via INTRAVENOUS
  Filled 2018-02-04: qty 1

## 2018-02-04 MED ORDER — ALBUTEROL SULFATE (2.5 MG/3ML) 0.083% IN NEBU
5.0000 mg | INHALATION_SOLUTION | Freq: Once | RESPIRATORY_TRACT | Status: AC
  Administered 2018-02-04: 5 mg via RESPIRATORY_TRACT
  Filled 2018-02-04: qty 6

## 2018-02-04 NOTE — ED Triage Notes (Signed)
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

## 2018-02-04 NOTE — Discharge Instructions (Signed)
Please take Benadryl, 25 or 50 mg every 6 hours as needed for rash or itching  Please take prednisone 40 mg daily for the next 5 days.  This will cause your blood sugar to go up but if you do not take it the allergic reaction may get worse and serious.  If you should develop increasing difficulty breathing, wheezing, shortness of breath, swelling of the throat tongue or lips or any severe or worsening symptoms you should take the EpiPen and call 911.  I have represcribed metformin, please take 500 mg twice daily, you will need to follow-up with a family doctor, please see the phone number below to arrange follow-up with someone locally within the next 2 weeks.

## 2018-02-04 NOTE — ED Provider Notes (Signed)
[facility] EMERGENCY DEPARTMENT Provider Note   CSN: [id number] Arrival date & time: 02/04/18  1610     History   Chief Complaint Chief Complaint  Patient presents with  . Allergic Reaction    HPI [name] [name] is a 62 y.o. male.  HPI  62 year old male, he has a known history of type 2 diabetes, he also has a history of having a pretty severe allergic reaction to an insect sting in the past, he is only had this happen one time but he did go into anaphylactic shock.  He states that today he had recurrent symptoms what multiple hornet type stinging insects got underneath the bibs of his overalls as he was in the yard cutting trees, he was stung multiple times, he does not recall exactly how many times but had fairly acute onset of urticaria itching weakness near syncope and significant and severe nausea.  When the paramedics found him he was lethargic, hypotensive to 80 and covered in urticaria from head to toe.  There is no airway issues, no sore throat, no swelling of the oropharynx.  He was given epinephrine 0.3 mg in the left shoulder intramuscular, given Benadryl intravenously, he was not given steroids.  They stated that within 5 minutes of receiving these medications his mental status was significantly improved, his nausea resolved, his blood pressure improved and his urticaria started to get better though on arrival it is still severe.  The patient denies having an EpiPen at home, he has never had to take an EpiPen in the past, he does not have allergic reactions to any other substances.  At this time the patient denies chest pain coughing shortness of breath headache blurred vision or any other symptoms other than the itchy rash and a groggy feeling.  Additional history obtained from family members who arrived shortly after confirming the patient's story, the patient now states that he is not taking his diabetic medications at all.  Past Medical History:    Diagnosis Date  . Arthritis   . Cancer ([place])    basal cell removed from nose  . Complication of anesthesia    problems using BR  . Diabetes ([place])   . Hypertension     Patient Active Problem List   Diagnosis Date Noted  . Basal cell carcinoma of skin of nose 02/14/2015  . Fatigue 02/14/2015  . 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 DJD (degenerative joint disease) of knee 09/21/2013  . OSTEOARTHRITIS 09/14/2009  . Diabetes ([place]) 04/21/2008  . HYPERLIPIDEMIA 04/21/2008  . HYPERTENSION 04/21/2008    Past Surgical History:  Procedure Laterality Date  . COLONOSCOPY    . HARDWARE REMOVAL Right 09/21/2013   Procedure: HARDWARE REMOVAL;  Surgeon: Lorn Junes, MD;  Location: [facility];  Service: Orthopedics;  Laterality: Right;  . KNEE ARTHROSCOPY Right    3 surgeries to right knee  . KNEE ARTHROSCOPY W/ DEBRIDEMENT Right   . SHOULDER ARTHROSCOPY WITH ROTATOR CUFF REPAIR Right   . TONSILLECTOMY    . TOTAL KNEE ARTHROPLASTY Right 09/21/2013   Procedure: TOTAL KNEE ARTHROPLASTY;  Surgeon: Lorn Junes, MD;  Location: [facility];  Service: Orthopedics;  Laterality: Right;        Home Medications    Prior to Admission medications   Medication Sig Start Date End Date Taking? Authorizing Provider  aspirin EC 325 MG EC tablet 1 tab a day for the next 30 days to prevent blood clots Patient not taking: Reported on 02/04/2018  09/22/13   Shepperson, Kirstin, PA-C  bromocriptine (PARLODEL) 2.5 MG tablet Take 0.5 tablets (1.25 mg total) by mouth at bedtime. Patient not taking: Reported on 02/04/2018 02/14/15   Renato Shin, MD  celecoxib (CELEBREX) 200 MG capsule 1 tab po q day with food for pain and  swelling Patient not taking: Reported on 02/04/2018 09/22/13   Shepperson, Kirstin, PA-C  EPINEPHrine (EPIPEN 2-PAK) 0.3 mg/0.3 mL IJ SOAJ injection Inject 0.3 mLs (0.3 mg total) into the muscle once as needed (for severe allergic reaction). CAll 911 immediately if you have to use this medicine 02/04/18   Noemi Chapel, MD  glimepiride (AMARYL) 2 MG tablet Take 1 tablet (2 mg total) by mouth daily before breakfast. Patient not taking: Reported on 02/04/2018 04/18/15   Renato Shin, MD  glucose blood (ONETOUCH VERIO) test strip 1 each by Other route daily. And lancets 1/day 250.02 Patient not taking: Reported on 02/04/2018 07/23/13   Renato Shin, MD  [facility] 300 MG TABS tablet TAKE 1 TABLET DAILY (NEEDS APPOINTMENT FOR FURTHER REFILLS) Patient not taking: Reported on 02/04/2018 06/22/15   Renato Shin, MD  JANUVIA 100 MG tablet TAKE 1 TABLET DAILY (NEEDS APPOINTMENT FOR FURTHER REFILLS) Patient not taking: Reported on 02/04/2018 06/22/15   Renato Shin, MD  metFORMIN (GLUCOPHAGE) 500 MG tablet Take 1 tablet (500 mg total) by mouth 2 (two) times daily with a meal. 02/04/18 03/06/18  Noemi Chapel, MD  predniSONE (DELTASONE) 20 MG tablet Take 2 tablets (40 mg total) by mouth daily. 02/04/18   Noemi Chapel, MD  [facility] 625 MG tablet TAKE 6 TABLETS DAILY (NEEDS APPOINTMENT FOR FURTHER REFILLS) Patient not taking: Reported on 02/04/2018 06/22/15   Renato Shin, MD    Family History Family History  Problem Relation Age of Onset  . Breast cancer Mother   . Lymphoma Father   . Diabetes Father   . Heart disease Father 47       bypass surgery    Social History Social History   Tobacco Use  . Smoking status: Former Research scientist (life sciences)  . Smokeless tobacco: Current User    Types: Snuff  Substance Use Topics  . Alcohol use: Yes    Comment: 6 drinks a week  . Drug use: No     Allergies   Bee venom   Review of Systems Review of Systems  All other systems reviewed and are negative.    Physical Exam Updated Vital Signs BP (!) 150/83   Pulse 83   Temp 97.6 F (36.4 C) (Oral)   Resp 12   Ht 6\' 3"  (1.905 m)   Wt 102.1 kg   SpO2 93%   BMI 28.12 kg/m   Physical Exam  Constitutional:  Ill-appearing, pale, covered in urticaria  HENT:  Oropharynx is clear, there is no edema of the uvula soft palate  tonsillar pillars tongue or mucous membranes.  Phonation is normal.  Eyes:  No periorbital edema, no chemosis, pupillary exam is normal, pupils are small bilaterally  Neck:  Supple neck with no trismus or torticollis, no lymphadenopathy.  Cardiovascular:  Tachycardia present to 115, strong pulses at the radial arteries, no edema or JVD  Pulmonary/Chest:  Lung sounds are clear, no wheezing tachypnea or increased work of breathing  Abdominal:  Soft nontender abdomen  Musculoskeletal:  No edema, diffuse urticaria  Neurological:  The patient is alert, oriented, follows commands, generally weak but able to follow commands equally with all 4 extremities.  Speech is clear  Skin:  Diffuse urticaria  covering the entire trunk, thorax, abdomen, arms, legs and the face.  No petechiae or purpura     ED Treatments / Results  Labs (all labs ordered are listed, but only abnormal results are displayed) Labs Reviewed  CBC WITH DIFFERENTIAL/PLATELET - Abnormal; Notable for the following components:      Result Value   WBC 11.2 (*)    Neutro Abs 8.9 (*)    All other components within normal limits  COMPREHENSIVE METABOLIC PANEL - Abnormal; Notable for the following components:   Chloride 97 (*)    Glucose, Bld 391 (*)    Creatinine, Ser 1.34 (*)    GFR calc non Af Amer 56 (*)    Anion gap 16 (*)    All other components within normal limits  CBG MONITORING, ED - Abnormal; Notable for the following components:   Glucose-Capillary 376 (*)    All other components within normal limits  CBG MONITORING, ED - Abnormal; Notable for the following components:   Glucose-Capillary 358 (*)    All other components within normal limits  CBG MONITORING, ED - Abnormal; Notable for the following components:   Glucose-Capillary 282 (*)    All other components within normal limits    EKG EKG Interpretation  Date/Time:  Tuesday February 04 2018 16:17:32 EDT Ventricular Rate:  96 PR Interval:    QRS  Duration: 99 QT Interval:  393 QTC Calculation: 497 R Axis:   -11 Text Interpretation:  Sinus rhythm Probable left atrial enlargement Low voltage, precordial leads Borderline prolonged QT interval since last tracing no significant change Confirmed by Noemi Chapel [phone]) on 02/04/2018 4:28:19 PM   Radiology No results found.  Procedures .Critical Care Performed by: Noemi Chapel, MD Authorized by: Noemi Chapel, MD   Critical care provider statement:    Critical care time (minutes):  35   Critical care was necessary to treat or prevent imminent or life-threatening deterioration of the following conditions:  Shock   Critical care was time spent personally by me on the following activities:  Discussions with consultants, evaluation of patient's response to treatment, examination of patient, ordering and performing treatments and interventions, ordering and review of laboratory studies, ordering and review of radiographic studies, pulse oximetry, re-evaluation of patient's condition, obtaining history from patient or surrogate and review of old charts   (including critical care time)  Medications Ordered in ED Medications  methylPREDNISolone sodium succinate (SOLU-MEDROL) 125 mg/2 mL injection 125 mg (125 mg Intravenous Given 02/04/18 1641)  diphenhydrAMINE (BENADRYL) injection 50 mg (50 mg Intravenous Given 02/04/18 1644)  sodium chloride 0.9 % bolus 1,000 mL (0 mLs Intravenous Stopped 02/04/18 1745)  albuterol (PROVENTIL) (2.5 MG/3ML) 0.083% nebulizer solution 5 mg (5 mg Nebulization Given 02/04/18 1645)  insulin aspart (novoLOG) injection 10 Units (10 Units Subcutaneous Given 02/04/18 1813)     Initial Impression / Assessment and Plan / ED Course  I have reviewed the triage vital signs and the nursing notes.  Pertinent labs & imaging results that were available during my care of the patient were reviewed by me and considered in my medical decision making (see chart for  details).  Clinical Course as of Feb 05 2032  Tue Feb 04, 2018  1726 Labs reviewed, leukocytosis of 28,366, metabolic panel showing hyperglycemia with a creatinine of 1.34 consistent with poorly controlled diabetes which the patient freely endorses.  Will give IV fluids, insulin and counseled patient on his medication use.  Patient agreeable.  Improved after Solu-Medrol area his hemodynamics  have been stable and rashes improving, no oxygen needed with sats of 95% on room air at this time   [BM]    Clinical Course User Index [BM] Noemi Chapel, MD    After epinephrine the patient seemed to have improved however he did appear to be in anaphylactic shock based on the pre-hospital history.  On exam he is still covered in urticaria from head to toe and may need repeat doses of epinephrine if becomes hypotensive.  We will give more antihistamines, Solu-Medrol as it was not given prehospital and keep the patient on a cardiac monitor.  He is oxygenating well without signs of swelling in the oropharynx or wheezing.  The patient is critically ill and will need a high level of care, frequent repeat evaluations and cardiac monitoring.  The patient appeared critically ill on arrival and improved significantly with critical interventions.  Multiple repeat evaluations, the patient gradually improved and after 4 hours of observation had essentially resolved complete urticaria, vital signs remained in a stable range with no recurrent hypoxia or hypotension and blood sugar was addressed with subcutaneous insulin and IV fluids.  The patient expressed his understanding, there is no signs of DKA, he was given strict instructions for return and close follow-up in the outpatient setting and was given prescriptions for an EpiPen, prednisone and instructed on metformin and Benadryl use.  He expressed his understanding.  Final Clinical Impressions(s) / ED Diagnoses   Final diagnoses:  Anaphylactic shock  Hyperglycemia     ED Discharge Orders         Ordered    metFORMIN (GLUCOPHAGE) 500 MG tablet  2 times daily with meals     02/04/18 2030    predniSONE (DELTASONE) 20 MG tablet  Daily     02/04/18 2030    EPINEPHrine (EPIPEN 2-PAK) 0.3 mg/0.3 mL IJ SOAJ injection  Once PRN     02/04/18 2030           Noemi Chapel, MD 02/04/18 2034

## 2020-04-18 DIAGNOSIS — U071 COVID-19: Secondary | ICD-10-CM

## 2020-04-18 HISTORY — DX: COVID-19: U07.1

## 2020-04-30 ENCOUNTER — Telehealth (HOSPITAL_COMMUNITY): Payer: Self-pay | Admitting: Family

## 2020-04-30 DIAGNOSIS — R69 Illness, unspecified: Secondary | ICD-10-CM

## 2020-04-30 NOTE — Telephone Encounter (Signed)
Called to discuss with [name] [name] about Covid symptoms and potential candidacy for the use of sotrovimab, a combination monoclonal antibody infusion for those with mild to moderate Covid symptoms and at a high risk of hospitalization.     Pt is qualified for this infusion at the infusion center due to co-morbid conditions and/or a member of an at-risk group, however unable to reach patient. VM left.   Tranika Scholler,NP

## 2020-05-01 ENCOUNTER — Telehealth (HOSPITAL_COMMUNITY): Payer: Self-pay | Admitting: Family

## 2020-05-01 ENCOUNTER — Telehealth: Payer: Self-pay | Admitting: Nurse Practitioner

## 2020-05-01 DIAGNOSIS — U071 COVID-19: Secondary | ICD-10-CM

## 2020-05-01 NOTE — Telephone Encounter (Signed)
Called to discuss with Resa Miner about Covid symptoms and potential candidacy for the use of sotrovimab, a combination monoclonal antibody infusion for those with mild to moderate Covid symptoms and at a high risk of hospitalization.     Pt is qualified for this infusion at the infusion center due to co-morbid conditions and/or a member of an at-risk group, however unable to reach patient.VM left.   Adesuwa Osgood,NP

## 2020-05-01 NOTE — Telephone Encounter (Signed)
Called to Discuss with patient about Covid symptoms and the use of the monoclonal antibody infusion for those with mild to moderate Covid symptoms and at a high risk of hospitalization.     Pt appears to qualify for this infusion due to co-morbid conditions and/or a member of an at-risk group in accordance with the FDA Emergency Use Authorization.    Unable to reach pt by telephone. Voicemail left with information on how to reach the mAb hotline.

## 2020-05-02 ENCOUNTER — Other Ambulatory Visit (HOSPITAL_COMMUNITY): Payer: Self-pay

## 2020-05-03 ENCOUNTER — Encounter: Payer: Self-pay | Admitting: Nurse Practitioner

## 2020-05-03 ENCOUNTER — Other Ambulatory Visit (HOSPITAL_COMMUNITY): Payer: Self-pay | Admitting: Physician Assistant

## 2020-05-03 DIAGNOSIS — I10 Essential (primary) hypertension: Secondary | ICD-10-CM | POA: Insufficient documentation

## 2020-05-03 NOTE — Progress Notes (Signed)
I connected by phone with [name] [name] on 05/03/2020 at 5:00 PM to discuss the potential use of a new treatment for mild to moderate COVID-19 viral infection in non-hospitalized patients.  This patient is a 64 y.o. male that meets the FDA criteria for Emergency Use Authorization of COVID monoclonal antibody casirivimab/imdevimab, bamlanivimab/eteseviamb, or sotrovimab.  Has a (+) direct SARS-CoV-2 viral test result  Has mild or moderate COVID-19   Is NOT hospitalized due to COVID-19  Is within 10 days of symptom onset  Has at least one of the high risk factor(s) for progression to severe COVID-19 and/or hospitalization as defined in EUA.  Specific high risk criteria : BMI > 25 and Diabetes   I have spoken and communicated the following to the patient or parent/caregiver regarding COVID monoclonal antibody treatment:  1. FDA has authorized the emergency use for the treatment of mild to moderate COVID-19 in adults and pediatric patients with positive results of direct SARS-CoV-2 viral testing who are 34 years of age and older weighing at least 40 kg, and who are at high risk for progressing to severe COVID-19 and/or hospitalization.  2. The significant known and potential risks and benefits of COVID monoclonal antibody, and the extent to which such potential risks and benefits are unknown.  3. Information on available alternative treatments and the risks and benefits of those alternatives, including clinical trials.  4. Patients treated with COVID monoclonal antibody should continue to self-isolate and use infection control measures (e.g., wear mask, isolate, social distance, avoid sharing personal items, clean and disinfect "high touch" surfaces, and frequent handwashing) according to CDC guidelines.   5. The patient or parent/caregiver has the option to accept or refuse COVID monoclonal antibody treatment.  After reviewing this information with the patient, the patient has agreed to  receive one of the available covid 19 monoclonal antibodies and will be provided an appropriate fact sheet prior to infusion. [name] Felix, PA-C 05/03/2020 5:00 PM

## 2020-05-04 ENCOUNTER — Ambulatory Visit (HOSPITAL_COMMUNITY)
Admission: RE | Admit: 2020-05-04 | Discharge: 2020-05-04 | Disposition: A | Discharge status: Home / Self Care | Payer: BC Managed Care – PPO | Source: Ambulatory Visit | Attending: Pulmonary Disease | Admitting: Pulmonary Disease

## 2020-05-04 DIAGNOSIS — Z6825 Body mass index (BMI) 25.0-25.9, adult: Secondary | ICD-10-CM | POA: Diagnosis not present

## 2020-05-04 DIAGNOSIS — E119 Type 2 diabetes mellitus without complications: Secondary | ICD-10-CM | POA: Insufficient documentation

## 2020-05-04 DIAGNOSIS — U071 COVID-19: Secondary | ICD-10-CM | POA: Insufficient documentation

## 2020-05-04 MED ORDER — FAMOTIDINE IN NACL 20-0.9 MG/50ML-% IV SOLN: 20.0000 mg | Freq: Once | INTRAVENOUS | Status: DC | PRN

## 2020-05-04 MED ORDER — DIPHENHYDRAMINE HCL 50 MG/ML IJ SOLN: 50.0000 mg | Freq: Once | INTRAMUSCULAR | Status: DC | PRN

## 2020-05-04 MED ORDER — EPINEPHRINE 0.3 MG/0.3ML IJ SOAJ: 0.3000 mg | Freq: Once | INTRAMUSCULAR | Status: DC | PRN

## 2020-05-04 MED ORDER — ALBUTEROL SULFATE HFA 108 (90 BASE) MCG/ACT IN AERS: 2.0000 | INHALATION_SPRAY | Freq: Once | RESPIRATORY_TRACT | Status: DC | PRN

## 2020-05-04 MED ORDER — SODIUM CHLORIDE 0.9 % IV SOLN: INTRAVENOUS | Status: DC | PRN

## 2020-05-04 MED ORDER — METHYLPREDNISOLONE SODIUM SUCC 125 MG IJ SOLR: 125.0000 mg | Freq: Once | INTRAMUSCULAR | Status: DC | PRN

## 2020-05-04 MED ORDER — SOTROVIMAB 500 MG/8ML IV SOLN
500.0000 mg | Freq: Once | INTRAVENOUS | Status: AC
  Administered 2020-05-04: 500 mg via INTRAVENOUS

## 2020-05-04 NOTE — Progress Notes (Signed)
Diagnosis: COVID-19  Physician: Dr. Patrick Wright  Procedure: Covid Infusion Clinic Med: Sotrovimab infusion - Provided patient with sotrovimab fact sheet for patients, parents, and caregivers prior to infusion.   Complications: No immediate complications noted  Discharge: Discharged home  If after the infusion you have any questions or concerns please call the Advanced Practice Provider at [phone] 

## 2020-05-04 NOTE — Discharge Instructions (Signed)
What types of side effects do monoclonal antibody drugs cause?  Common side effects  In general, the more common side effects caused by monoclonal antibody drugs include: . Allergic reactions, such as hives or itching . Flu-like signs and symptoms, including chills, fatigue, fever, and muscle aches and pains . Nausea, vomiting . Diarrhea . Skin rashes . Low blood pressure   The CDC is recommending patients who receive monoclonal antibody treatments wait at least 90 days before being vaccinated.  Currently, there are no data on the safety and efficacy of mRNA COVID-19 vaccines in persons who received monoclonal antibodies or convalescent plasma as part of COVID-19 treatment. Based on the estimated half-life of such therapies as well as evidence suggesting that reinfection is uncommon in the 90 days after initial infection, vaccination should be deferred for at least 90 days, as a precautionary measure until additional information becomes available, to avoid interference of the antibody treatment with vaccine-induced immune responses.  10 Things You Can Do to Manage Your COVID-19 Symptoms at Home If you have possible or confirmed COVID-19: 1. Stay home from work and school. And stay away from other public places. If you must go out, avoid using any kind of public transportation, ridesharing, or taxis. 2. Monitor your symptoms carefully. If your symptoms get worse, call your healthcare provider immediately. 3. Get rest and stay hydrated. 4. If you have a medical appointment, call the healthcare provider ahead of time and tell them that you have or may have COVID-19. 5. For medical emergencies, call 911 and notify the dispatch personnel that you have or may have COVID-19. 6. Cover your cough and sneezes with a tissue or use the inside of your elbow. 7. Wash your hands often with soap and water for at least 20 seconds or clean your hands with an alcohol-based hand sanitizer that contains at least  60% alcohol. 8. As much as possible, stay in a specific room and away from other people in your home. Also, you should use a separate bathroom, if available. If you need to be around other people in or outside of the home, wear a mask. 9. Avoid sharing personal items with other people in your household, like dishes, towels, and bedding. 10. Clean all surfaces that are touched often, like counters, tabletops, and doorknobs. Use household cleaning sprays or wipes according to the label instructions. [email or web address] 12/17/2018 This information is not intended to replace advice given to you by your health care provider. Make sure you discuss any questions you have with your health care provider. Document Revised: 05/21/2019 Document Reviewed: 05/21/2019 Elsevier Patient Education  [employer].   If after the infusion you have any questions or concerns please call the Advanced Practice Provider at [phone]

## 2022-07-12 ENCOUNTER — Other Ambulatory Visit: Payer: Self-pay | Admitting: Orthopaedic Surgery

## 2022-07-12 DIAGNOSIS — Z01818 Encounter for other preprocedural examination: Secondary | ICD-10-CM

## 2022-07-23 ENCOUNTER — Other Ambulatory Visit: Payer: BC Managed Care – PPO

## 2022-07-26 ENCOUNTER — Ambulatory Visit
Admission: RE | Admit: 2022-07-26 | Discharge: 2022-07-26 | Disposition: A | Discharge status: Home / Self Care | Payer: Self-pay | Source: Ambulatory Visit | Attending: Orthopaedic Surgery | Admitting: Orthopaedic Surgery

## 2022-07-26 DIAGNOSIS — Z01818 Encounter for other preprocedural examination: Secondary | ICD-10-CM

## 2023-05-27 ENCOUNTER — Telehealth (HOSPITAL_COMMUNITY): Payer: Self-pay

## 2023-05-27 ENCOUNTER — Encounter (HOSPITAL_COMMUNITY): Payer: Self-pay

## 2023-05-27 NOTE — Telephone Encounter (Signed)
Attempted to call patient in regards to Cardiac Rehab - LM on VM Mailed letter 

## 2023-05-27 NOTE — Telephone Encounter (Signed)
Outside/paper referral received by Dr. Eddie Candle from [place]. Will fax over Physician order and request further documents. Insurance benefits and eligibility to be determined.

## 2023-06-18 ENCOUNTER — Telehealth (HOSPITAL_COMMUNITY): Payer: Self-pay

## 2023-06-18 NOTE — Telephone Encounter (Signed)
 No response from in regards to CR.  Closed referral
